# Patient Record
Sex: Female | Born: 1970 | Race: White | Hispanic: No | Smoking: Never smoker
Health system: Southern US, Community
[De-identification: ages and names within clinical notes are randomized; demographics above are authoritative.]

## PROBLEM LIST (undated history)

## (undated) DIAGNOSIS — Z9109 Other allergy status, other than to drugs and biological substances: Secondary | ICD-10-CM

## (undated) DIAGNOSIS — T7840XA Allergy, unspecified, initial encounter: Secondary | ICD-10-CM

## (undated) DIAGNOSIS — J45909 Unspecified asthma, uncomplicated: Secondary | ICD-10-CM

## (undated) HISTORY — DX: Unspecified asthma, uncomplicated: J45.909

## (undated) HISTORY — DX: Allergy, unspecified, initial encounter: T78.40XA

## (undated) HISTORY — PX: EYE SURGERY: SHX253

## (undated) HISTORY — DX: Other allergy status, other than to drugs and biological substances: Z91.09

---

## 1995-09-22 HISTORY — PX: KNEE SURGERY: SHX244

## 1998-11-07 ENCOUNTER — Inpatient Hospital Stay (HOSPITAL_COMMUNITY): Admission: AD | Admit: 1998-11-07 | Discharge: 1998-11-09 | Payer: Self-pay | Admitting: Obstetrics and Gynecology

## 1998-12-23 ENCOUNTER — Other Ambulatory Visit: Admission: RE | Admit: 1998-12-23 | Discharge: 1998-12-23 | Payer: Self-pay | Admitting: Obstetrics and Gynecology

## 1999-01-03 ENCOUNTER — Encounter (HOSPITAL_COMMUNITY): Admission: RE | Admit: 1999-01-03 | Discharge: 1999-04-03 | Payer: Self-pay | Admitting: *Deleted

## 2000-10-11 ENCOUNTER — Other Ambulatory Visit: Admission: RE | Admit: 2000-10-11 | Discharge: 2000-10-11 | Payer: Self-pay | Admitting: Gynecology

## 2000-10-27 ENCOUNTER — Encounter: Admission: RE | Admit: 2000-10-27 | Discharge: 2001-01-25 | Payer: Self-pay | Admitting: Gynecology

## 2001-05-06 ENCOUNTER — Inpatient Hospital Stay (HOSPITAL_COMMUNITY): Admission: AD | Admit: 2001-05-06 | Discharge: 2001-05-08 | Payer: Self-pay | Admitting: Gynecology

## 2001-06-15 ENCOUNTER — Other Ambulatory Visit: Admission: RE | Admit: 2001-06-15 | Discharge: 2001-06-15 | Payer: Self-pay | Admitting: Gynecology

## 2002-07-25 ENCOUNTER — Other Ambulatory Visit: Admission: RE | Admit: 2002-07-25 | Discharge: 2002-07-25 | Payer: Self-pay | Admitting: Gynecology

## 2004-04-24 ENCOUNTER — Other Ambulatory Visit: Admission: RE | Admit: 2004-04-24 | Discharge: 2004-04-24 | Payer: Self-pay | Admitting: Gynecology

## 2004-11-10 ENCOUNTER — Inpatient Hospital Stay (HOSPITAL_COMMUNITY): Admission: AD | Admit: 2004-11-10 | Discharge: 2004-11-12 | Payer: Self-pay | Admitting: Gynecology

## 2004-12-23 ENCOUNTER — Other Ambulatory Visit: Admission: RE | Admit: 2004-12-23 | Discharge: 2004-12-23 | Payer: Self-pay | Admitting: Gynecology

## 2006-01-06 ENCOUNTER — Other Ambulatory Visit: Admission: RE | Admit: 2006-01-06 | Discharge: 2006-01-06 | Payer: Self-pay | Admitting: Gynecology

## 2008-11-26 ENCOUNTER — Encounter: Payer: Self-pay | Admitting: Gynecology

## 2008-11-26 ENCOUNTER — Ambulatory Visit: Payer: Self-pay | Admitting: Gynecology

## 2008-11-26 ENCOUNTER — Other Ambulatory Visit: Admission: RE | Admit: 2008-11-26 | Discharge: 2008-11-26 | Payer: Self-pay | Admitting: Gynecology

## 2009-04-27 ENCOUNTER — Ambulatory Visit: Payer: Self-pay | Admitting: Diagnostic Radiology

## 2009-04-27 ENCOUNTER — Emergency Department (HOSPITAL_BASED_OUTPATIENT_CLINIC_OR_DEPARTMENT_OTHER): Admission: EM | Admit: 2009-04-27 | Discharge: 2009-04-27 | Payer: Self-pay | Admitting: Emergency Medicine

## 2011-02-06 NOTE — Discharge Summary (Signed)
[facility]  Patient:    MANHATTAN, MCCUEN Visit Number: [id number] MRN: [id number]          Service Type: OBS Location: 910A [id number] [id number] Attending Physician:  Douglass Rivers Dictated by:   Antony Contras, N.P. Admit Date:  05/06/2001 Discharge Date: 05/08/2001                             Discharge Summary  DISCHARGE DIAGNOSES:          Intrauterine pregnancy at term, spontaneous onset of labor.  PROCEDURE:                    Normal spontaneous vaginal delivery of viable infant over intact perineum with repair of second degree laceration.  HISTORY OF PRESENT ILLNESS:   Patient is a 40 year old gravida 2, para 1-0-0-1 with LMP July 25, 2000, [facility] May 01, 2001.  Prenatal course was uncomplicated.  PRENATAL LABORATORIES:        Blood type O+.  Antibody screen negative.  RPR, HBSAG, HIV nonreactive.  Rubella immune.  MSAFP normal.  GBS negative.  HOSPITAL COURSE:              Patient was admitted on May 06, 2001 in active labor.  Cervix on admission was 4 cm.  She was transferred to labor and delivery and while getting IV fluids in preparation for epidural complained of pressure and had dilated to a rim and -1 station.  She was delivered of an Apgar 8/9 female infant weighing 8 pounds 7 ounces over an intact perineum. Placenta was spontaneous.  Second degree laceration was repaired.  Postpartum course she remained afebrile.  Had no difficulty voiding.  Was able to be discharged in satisfactory condition on her second postpartum day.  LABORATORIES:                 CBC:  Hematocrit 31, hemoglobin 10.8, WBC 15.1, platelets 173,000.  DISPOSITION:                  Follow-up in six weeks.  Continue prenatal vitamins and iron, Motrin and Tylox for pain. Dictated by:   Antony Contras, N.P. Attending Physician:  Douglass Rivers DD:  05/20/01 TD:  05/20/01 Job: [id number]

## 2011-02-06 NOTE — H&P (Signed)
NAME:  Karen Forbes, Karen Forbes            ACCOUNT NO.:  [id number]   MEDICAL RECORD NO.:  [id number]          PATIENT TYPE:  INP   LOCATION:  9110                          FACILITY:  [facility]   PHYSICIAN:  Juan H. Lily Peer, M.D.DATE OF BIRTH:  02-11-1971   DATE OF ADMISSION:  11/10/2004  DATE OF DISCHARGE:                                HISTORY & PHYSICAL   CHIEF COMPLAINT:  Contractions.   HISTORY:  The patient is a 40 year old gravida 3, para 2 with a estimated  date of confinement of November 06, 2004, currently 40 4/[redacted] weeks gestation.  She presented to [facility] this morning complaining of contractions  that increased in intensity over the past 24 hours.  On arrival to maternity  admission, she was found to be 6 cm dilated, complete effacement and -1  station with intact membranes, reassuring fetal heart rate tracings,  contracting every two to three minutes apart.  The patient's prenatal record  is significant for the fact that she is of advanced maternal age and when  offered the opportunity to have amniocentesis, she declined.  She did have a  normal cystic fibrosis screen and decided that she did want to have a  maternal serum alpha fetoprotein test and when she was informed that her  maternal serum alpha fetoprotein was at increased risk for Down syndrome,  she was once again offered genetic amniocentesis, of which she and her  husband had declined.  Ultrasound screen at 18 and 20 weeks was reported to  be normal and otherwise, she had the remainder of her prenatal course had  been uneventful.   PAST MEDICAL HISTORY:  The patient denies any allergies.   She had two prior normal spontaneous vaginal deliveries in 2000 and 2002.  She denies any prior surgery, except on her eye.  She has no other medical  problems recorded per se.   REVIEW OF SYSTEMS:  See [place] form.   PHYSICAL EXAMINATION:  VITAL SIGNS:  Temperature 97.4, pulse 95, blood  pressure 99/65.  HEENT:   Unremarkable.  NECK:  Supple.  Trachea midline.  No carotid bruit or thyromegaly.  LUNGS:  Clear to auscultation without any rhonchi or wheezing.  HEART:  Regular rate and rhythm, no murmurs or gallops.  BREASTS:  Exam not done.  ABDOMEN:  Gravid uterus, vertex presentation by [employer].  PELVIC:  Cervix was 6 cm, completely effaced and -1 station, intact  membranes.  EXTREMITIES:  DTR 1+, negative clonus.  Trace edema.   PRENATAL LABORATORY:  O positive blood type,  negative antibody screen.  VDRL was nonreactive.  Hepatitis B surface antigen and HIV were negative.  Rubella with evidence of immunity.  The patient declined maternal serum  alpha fetoprotein.  Cystic fibrosis described to be normal.  Diabetes screen  was normal and GBS culture was negative.   ASSESSMENT:  40 year old gravida 3, para 3 at 67 4/[redacted] weeks gestation in  active labor with reassuring fetal heart rate tracing.  The patient  underwent artificial rupture of membranes with clear amniotic fluid.  The  patient refused internal monitoring.  Reassuring heart rate tracings  noted.  Contractions every two to three minutes part. She was  given 0.5 mg of Stadol, along with 12.5 mg of Phenergan IV.  She did not  wish to have an epidural.  GBS culture was negative and anticipate vaginal  delivery.   PLAN:  As per assessment above.      JHF/MEDQ  D:  11/10/2004  T:  11/10/2004  Job:  [id number]

## 2017-02-08 ENCOUNTER — Encounter: Payer: Self-pay | Admitting: Family Medicine

## 2017-02-08 ENCOUNTER — Ambulatory Visit (HOSPITAL_BASED_OUTPATIENT_CLINIC_OR_DEPARTMENT_OTHER)
Admission: RE | Admit: 2017-02-08 | Discharge: 2017-02-08 | Disposition: A | Discharge status: Home / Self Care | Payer: Managed Care, Other (non HMO) | Source: Ambulatory Visit | Attending: Family Medicine | Admitting: Family Medicine

## 2017-02-08 ENCOUNTER — Ambulatory Visit (INDEPENDENT_AMBULATORY_CARE_PROVIDER_SITE_OTHER): Payer: Managed Care, Other (non HMO) | Admitting: Family Medicine

## 2017-02-08 ENCOUNTER — Telehealth: Payer: Self-pay | Admitting: Behavioral Health

## 2017-02-08 VITALS — BP 114/72 | HR 95 | Temp 99.4°F | Ht 65.5 in | Wt 187.2 lb

## 2017-02-08 DIAGNOSIS — Z1231 Encounter for screening mammogram for malignant neoplasm of breast: Secondary | ICD-10-CM | POA: Insufficient documentation

## 2017-02-08 DIAGNOSIS — Z13220 Encounter for screening for lipoid disorders: Secondary | ICD-10-CM | POA: Diagnosis not present

## 2017-02-08 DIAGNOSIS — Z9109 Other allergy status, other than to drugs and biological substances: Secondary | ICD-10-CM | POA: Insufficient documentation

## 2017-02-08 DIAGNOSIS — M25512 Pain in left shoulder: Secondary | ICD-10-CM

## 2017-02-08 DIAGNOSIS — G8929 Other chronic pain: Secondary | ICD-10-CM | POA: Insufficient documentation

## 2017-02-08 DIAGNOSIS — Z131 Encounter for screening for diabetes mellitus: Secondary | ICD-10-CM

## 2017-02-08 DIAGNOSIS — Z638 Other specified problems related to primary support group: Secondary | ICD-10-CM

## 2017-02-08 DIAGNOSIS — Z1329 Encounter for screening for other suspected endocrine disorder: Secondary | ICD-10-CM

## 2017-02-08 DIAGNOSIS — Z124 Encounter for screening for malignant neoplasm of cervix: Secondary | ICD-10-CM

## 2017-02-08 DIAGNOSIS — Z1239 Encounter for other screening for malignant neoplasm of breast: Secondary | ICD-10-CM

## 2017-02-08 DIAGNOSIS — Z130 Encounter for screening for diseases of the blood and blood-forming organs and certain disorders involving the immune mechanism: Secondary | ICD-10-CM

## 2017-02-08 LAB — CBC
HCT: 40.4 % (ref 36.0–46.0)
HEMOGLOBIN: 13.7 g/dL (ref 12.0–15.0)
MCHC: 33.8 g/dL (ref 30.0–36.0)
MCV: 89.9 fl (ref 78.0–100.0)
Platelets: 278 10*3/uL (ref 150.0–400.0)
RBC: 4.49 Mil/uL (ref 3.87–5.11)
RDW: 14.2 % (ref 11.5–15.5)
WBC: 9.3 10*3/uL (ref 4.0–10.5)

## 2017-02-08 LAB — LIPID PANEL
CHOL/HDL RATIO: 4
Cholesterol: 213 mg/dL — ABNORMAL HIGH (ref 0–200)
HDL: 52.3 mg/dL (ref >39.00)
LDL Cholesterol: 122 mg/dL — ABNORMAL HIGH (ref 0–99)
NonHDL: 160.75
Triglycerides: 195 mg/dL — ABNORMAL HIGH (ref 0.0–149.0)
VLDL: 39 mg/dL (ref 0.0–40.0)

## 2017-02-08 LAB — COMPREHENSIVE METABOLIC PANEL
ALT: 13 U/L (ref 0–35)
AST: 15 U/L (ref 0–37)
Albumin: 4 g/dL (ref 3.5–5.2)
Alkaline Phosphatase: 53 U/L (ref 39–117)
BILIRUBIN TOTAL: 0.4 mg/dL (ref 0.2–1.2)
BUN: 14 mg/dL (ref 6–23)
CHLORIDE: 106 meq/L (ref 96–112)
CO2: 25 meq/L (ref 19–32)
Calcium: 8.8 mg/dL (ref 8.4–10.5)
Creatinine, Ser: 0.97 mg/dL (ref 0.40–1.20)
GFR: 65.15 mL/min (ref >60.00)
GLUCOSE: 114 mg/dL — AB (ref 70–99)
Potassium: 3.9 mEq/L (ref 3.5–5.1)
Sodium: 137 mEq/L (ref 135–145)
Total Protein: 6.7 g/dL (ref 6.0–8.3)

## 2017-02-08 LAB — HEMOGLOBIN A1C: Hgb A1c MFr Bld: 5.7 % (ref 4.6–6.5)

## 2017-02-08 LAB — TSH: TSH: 0.52 u[IU]/mL (ref 0.35–4.50)

## 2017-02-08 MED ORDER — ALBUTEROL SULFATE HFA 108 (90 BASE) MCG/ACT IN AERS: 2.0000 | INHALATION_SPRAY | Freq: Four times a day (QID) | RESPIRATORY_TRACT | 0 refills | Status: AC | PRN

## 2017-02-08 NOTE — Patient Instructions (Addendum)
It was very nice to see you today!  I will be in touch with your labs asap Please have your blood drawn, and then stop by imaging services on the ground floor to schedule your mammo (and maybe even have it done today!)  I will set you up to see Dr. Ihor Dow for your pap   I am sorry for all the trouble your family has been through but glad that you are doing well.  Please let me know if I can be of any assistance

## 2017-02-08 NOTE — Telephone Encounter (Signed)
Unable to reach patient at time of Pre-Visit Call.  Left message for patient to return call when available.    
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
## 2017-03-08 ENCOUNTER — Telehealth: Payer: Self-pay | Admitting: *Deleted

## 2017-03-08 NOTE — Telephone Encounter (Signed)
Received Medical records from [facility], forwarded to provider/SLS 06/18

## 2017-03-09 ENCOUNTER — Telehealth: Payer: Self-pay | Admitting: Family Medicine

## 2017-03-09 NOTE — Telephone Encounter (Signed)
Received records from [facility]- will abstract and scan as needed

## 2017-03-12 ENCOUNTER — Encounter: Payer: Self-pay | Admitting: Family Medicine

## 2017-03-12 ENCOUNTER — Ambulatory Visit (INDEPENDENT_AMBULATORY_CARE_PROVIDER_SITE_OTHER): Payer: Managed Care, Other (non HMO) | Admitting: Family Medicine

## 2017-03-12 VITALS — BP 126/60 | HR 91 | Temp 98.4°F | Ht 65.5 in | Wt 192.0 lb

## 2017-03-12 DIAGNOSIS — J00 Acute nasopharyngitis [common cold]: Secondary | ICD-10-CM | POA: Diagnosis not present

## 2017-03-12 DIAGNOSIS — J4520 Mild intermittent asthma, uncomplicated: Secondary | ICD-10-CM

## 2017-03-12 DIAGNOSIS — J45909 Unspecified asthma, uncomplicated: Secondary | ICD-10-CM | POA: Insufficient documentation

## 2017-03-12 NOTE — Progress Notes (Signed)
Chief Complaint  Patient presents with  . Dry eyes    wheezing,throat burning,cough(dry),h/a-sxs started on Tues    Karen Forbes here for URI complaints.  The patient has been having wheezing, burning throat, cough, headache, and burning/dry eyes over the past 3 days. 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 Ever since then, both she and her coworkers have been having similar symptoms. No one has been having fevers or rigors. She has been using artificial tears and Zyrtec with little relief.   ROS:  Const: Denies fevers HEENT: As noted in HPI Lungs: +cough  Past Medical History:  Diagnosis Date  . Allergy-induced asthma   . Environmental allergies    Family History  Problem Relation Age of Onset  . Arthritis Mother   . Hyperlipidemia Father   . Hypertension Father   . Hypertension Maternal Grandfather   . Diabetes Maternal Grandfather   . Arthritis Paternal Grandmother   . Breast cancer Paternal Grandmother     BP 126/60 (BP Location: Left Arm, Patient Position: Sitting, Cuff Size: Normal)   Pulse 91   Temp 98.4 F (36.9 C) (Oral)   Ht 5' 5.5" (1.664 m)   Wt 192 lb (87.1 kg)   LMP 03/08/2017 (Exact Date)   SpO2 98%   BMI 31.46 kg/m  General: Awake, alert, appears stated age HEENT: AT, [place], ears patent b/l and TM's neg, nares patent w/o discharge, pharynx pink and without exudates, MMM Neck: No masses or asymmetry Heart: RRR, no murmurs, no bruits Lungs: CTAB, no accessory muscle use Psych: Age appropriate judgment and insight, normal mood and affect  Acute irritant rhinitis  Mild intermittent extrinsic asthma without complication  Start using albuterol prn. Letter for work stating her health will be optimized in a setting without high concentrations of chlorine. Also excused her over next few days.  Continue to push fluids, practice good hand hygiene, cover mouth when  coughing. F/u prn. If starting to experience fevers, shaking, or shortness of breath, seek immediate care. If albuterol is not helpful, she will call us back and we will call in an ICS. Pt voiced understanding and agreement to the plan.  [place], DO 03/12/17 10:28 AM

## 2017-03-12 NOTE — Patient Instructions (Signed)
Artificial tears is ideal.   Keep up with the Zyrtec and albuterol as needed.   If the albuterol is not giving you enough relief, call us and we will call in new inhaler to take scheduled.

## 2017-03-16 ENCOUNTER — Telehealth: Payer: Self-pay | Admitting: Family Medicine

## 2017-03-16 NOTE — Telephone Encounter (Signed)
Caller name: Luanna Salk from [place]  Call back number: [phone]    Reason for call:  Workers [employer] from [employer] were patient works requesting office notes for 03/12/17 please fax to # [phone] and note claim # [id number]

## 2017-03-17 ENCOUNTER — Encounter: Payer: Self-pay | Admitting: Obstetrics & Gynecology

## 2017-03-17 ENCOUNTER — Ambulatory Visit (INDEPENDENT_AMBULATORY_CARE_PROVIDER_SITE_OTHER): Payer: Managed Care, Other (non HMO) | Admitting: Obstetrics & Gynecology

## 2017-03-17 VITALS — BP 124/60 | HR 68 | Ht 65.0 in | Wt 198.0 lb

## 2017-03-17 DIAGNOSIS — Z01419 Encounter for gynecological examination (general) (routine) without abnormal findings: Secondary | ICD-10-CM | POA: Diagnosis not present

## 2017-03-17 DIAGNOSIS — N393 Stress incontinence (female) (male): Secondary | ICD-10-CM

## 2017-03-17 NOTE — Telephone Encounter (Signed)
Office notes faxed as requested.

## 2017-03-17 NOTE — Progress Notes (Signed)
Subjective:     Karen Forbes is a 46 y.o. female here for a routine exam. G3P3  Pt breast fed for 8-9 months per child  Current complaints: no GYN concerns to day.  Caffeine 1-2 cups of coffee. Sometimes 4 cups per day. Did Kegel exercises in the past but, hey 'did not help'. Pt does ot recall how they were done or how long she performed them. She reports that her sx have occurred since the birht of her first child. She is married but, separated. She works as a Cabin crew part time.    PGM breast cancer in her 84's.    Gynecologic History Patient's last menstrual period was 03/08/2017 (exact date). Contraception: none x 1 1 1/2 years Last Pap: unsure  2010 ASCUS- pt does not know what happened to this. Last mammogram: 02/08/2017. Results were: normal  Obstetric History OB History  Gravida Para Term Preterm AB Living  3 3          SAB TAB Ectopic Multiple Live Births          3    # Outcome Date GA Lbr Len/2nd Weight Sex Delivery Anes PTL Lv  3 Para      Vag-Spont     2 Para      Vag-Spont     1 Para      Vag-Spont         The following portions of the patient's history were reviewed and updated as appropriate: allergies, current medications, past family history, past medical history, past social history, past surgical history and problem list.  Review of Systems Pertinent items are noted in HPI.    Objective:  BP 124/60 (BP Location: Left Arm)   Pulse 68   Ht 5\' 5"  (1.651 m)   Wt 198 lb (89.8 kg)   LMP 03/08/2017 (Exact Date)   BMI 32.95 kg/m   General Appearance:    Alert, cooperative, no distress, appears stated age  Head:    Normocephalic, without obvious abnormality, atraumatic  Eyes:    conjunctiva/corneas clear, EOM's intact, both eyes  Ears:    Normal external ear canals, both ears  Nose:   Nares normal, septum midline, mucosa normal, no drainage    or sinus tenderness  Throat:   Lips, mucosa, and tongue normal; teeth and gums normal  Neck:   Supple,  symmetrical, trachea midline, no adenopathy;    thyroid:  no enlargement/tenderness/nodules  Back:     Symmetric, no curvature, ROM normal, no CVA tenderness  Lungs:     Clear to auscultation bilaterally, respirations unlabored  Chest Wall:    No tenderness or deformity   Heart:    Regular rate and rhythm, S1 and S2 normal, no murmur, rub   or gallop  Breast Exam:    No tenderness, masses, or nipple abnormality  Abdomen:     Soft, non-tender, bowel sounds active all four quadrants,    no masses, no organomegaly  Genitalia:    Normal female without lesion, discharge or tenderness q-tip test 60 degrees of laxity. There is some mild uterine prolapse. The cervix is very anterior.     Extremities:   Extremities normal, atraumatic, no cyanosis or edema  Pulses:   2+ and symmetric all extremities  Skin:   Skin color, texture, turgor normal, no rashes or lesions    Assessment:    Healthy female exam.   Stress urinary incontinence- reviewed treatment options with pt including conservative and surgical management.  Pt is not interested in surgery at this time.     Plan:    Follow up in: 3 months.   to reeval SUI Reviewed Kegel exercises offered PT for assist with Kegel's etc Rec eliminate of discontinue caffeine F/u PAP and hrHPV F/u in 1 year for annual  Reviewed mammogram screening guidelines   Eloyse Causey L. Ihor Dow, M.D., [place] .

## 2017-03-17 NOTE — Patient Instructions (Signed)

## 2017-03-17 NOTE — Addendum Note (Signed)
Addended by: Phill Myron on: 03/17/2017 03:02 PM   Modules accepted: Orders

## 2017-03-23 LAB — CYTOLOGY - PAP
Diagnosis: NEGATIVE
HPV (WINDOPATH): NOT DETECTED

## 2017-10-01 ENCOUNTER — Ambulatory Visit: Payer: 59 | Admitting: Psychology

## 2017-10-11 ENCOUNTER — Ambulatory Visit: Payer: 59 | Admitting: Psychology

## 2018-01-04 NOTE — Progress Notes (Signed)
Karen Forbes at [facility] [address] [place], [place] [address] [phone]  Date:  01/06/2018   Name:  Karen Forbes   DOB:  1971-04-11   MRN:  [id number]  PCP:  Darreld Mclean, MD    Chief Complaint: Foot Pain (c/o left foot pain since Oct or Nov. )   History of Present Illness:  Karen Forbes is a 47 y.o. very pleasant female patient who presents with the following:  Generally healthy woman here today with concern of a left foot issue She has noted pain with walking, etc  This started in the fall- October or November.  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;did not always use them but she is trying to be better about wearing them  She works around water a lot and had been wearing flip flops a lot which are not that supportive  She started using a compression sock for her foot as well, thinking that it might be plantar fascitis However her foot is even hurting at rest now  NKI that she can recall Pain is worst after she gets up in the am or if sitting for a while and then she gets up   She did walk about 3.5 miles the other day- this was fine, but the next day she had a lot of pain  She is otherwise well- no fever or chills No other injury except her ankle will sometimes feel sore, she thinks from walking on the lateral side of her foot to protect the bottom of her foot   BP Readings from Last 3 Encounters:  01/06/18 98/61  03/17/17 124/60  03/12/17 126/60      Patient Active Problem List   Diagnosis Date Noted  . 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 Chronic left shoulder pain 02/08/2017  . Environmental allergies 02/08/2017    Past Medical History:  Diagnosis Date  . Allergy-induced asthma   . Environmental allergies     Past Surgical History:  Procedure Laterality Date  . EYE SURGERY      Corrective Eye Surgery in 1974, 1976 and 1984  . KNEE SURGERY  1997    Social History   Tobacco Use  . Smoking status: Never Smoker  . Smokeless tobacco: Never Used  Substance Use Topics  . Alcohol use: Yes  . Drug use: No    Family History  Problem Relation Age of Onset  . Arthritis Mother   . Hyperlipidemia Father   . Hypertension Father   . Hypertension Maternal Grandfather   . Diabetes Maternal Grandfather   . Arthritis Paternal Grandmother   . Breast cancer Paternal Grandmother     No Known Allergies  Medication list has been reviewed and updated.  Current Outpatient Medications on File Prior to Visit  Medication Sig Dispense Refill  . albuterol (PROVENTIL HFA;VENTOLIN HFA) 108 (90 Base) MCG/ACT inhaler Inhale 2 puffs into the lungs every 6 (six) hours as needed for wheezing or shortness of breath. 1 Inhaler 0  . cetirizine (ZYRTEC) 10 MG tablet Take 10 mg by mouth daily.     No current facility-administered medications on file prior to visit.     Review of Systems:  As per HPI- otherwise negative.   Physical Examination: Vitals:   01/06/18 1549  BP: 98/61  Pulse: 87  Temp: 98.8 F (37.1 C)  SpO2: 98%   Vitals:   01/06/18 1549  Weight: 194 lb 9.6 oz (88.3 kg)  Height: 5\' 5"  (1.651 m)   Body mass index is 32.38 kg/m. Ideal Body Weight: Weight in (lb) to have BMI = 25: 149.9  GEN: WDWN, NAD, Non-toxic, A & O x 3, looks well, overweight HEENT: Atraumatic, Normocephalic. Neck supple. No masses, No LAD. Ears and Nose: No external deformity. CV: RRR, No M/G/R. No JVD. No thrill. No extra heart sounds. PULM: CTA B, no wheezes, crackles, rhonchi. No retractions. No resp. distress. No accessory muscle use. EXTR: No c/c/e NEURO Normal gait.  PSYCH: Normally interactive. Conversant. Not depressed or anxious appearing.  Calm demeanor.  Left foot:  She is quite tender over the proximal plantar insertion of the plantar fascis Normal arches Foot is  otherwise normal - no other tenderness, redness, swelling Ankle is negative. Achilles normal   Assessment and Plan: Plantar fasciitis  Left foot pain  Here today with left sided plantar fasciitis  Discussed management of this condition with her in detail.  A friend already gave her some sort of foot brace which really seems to have helped.  She will continue to use this, and we discussed ice massage and stretching. She will let me know if not improving   Signed Lamar Blinks, MD

## 2018-01-06 ENCOUNTER — Ambulatory Visit: Payer: BLUE CROSS/BLUE SHIELD | Admitting: Family Medicine

## 2018-01-06 ENCOUNTER — Encounter: Payer: Self-pay | Admitting: Family Medicine

## 2018-01-06 VITALS — BP 110/75 | HR 87 | Temp 98.8°F | Ht 65.0 in | Wt 194.6 lb

## 2018-01-06 DIAGNOSIS — M722 Plantar fascial fibromatosis: Secondary | ICD-10-CM

## 2018-01-06 DIAGNOSIS — M79672 Pain in left foot: Secondary | ICD-10-CM

## 2018-01-06 NOTE — Patient Instructions (Addendum)
It does seem that you have plantar fascitis. Use your foot brace at night as needed Try some ice massage, stretching before you get out of bed or before you get up after sitting  You might try some aleve or ibuprofen for a week or two  Try to avoid being barefoot or wearing flip flops   Let me know if this does not settle down- in that case I can refer you to sports med!  This may take a while to resolve completely, but please alert me if not at least some better soon
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
## 2018-03-07 ENCOUNTER — Encounter: Payer: Self-pay | Admitting: Family Medicine

## 2018-03-07 ENCOUNTER — Ambulatory Visit (INDEPENDENT_AMBULATORY_CARE_PROVIDER_SITE_OTHER): Payer: BLUE CROSS/BLUE SHIELD | Admitting: Family Medicine

## 2018-03-07 ENCOUNTER — Ambulatory Visit (HOSPITAL_BASED_OUTPATIENT_CLINIC_OR_DEPARTMENT_OTHER)
Admission: RE | Admit: 2018-03-07 | Discharge: 2018-03-07 | Disposition: A | Discharge status: Home / Self Care | Payer: BLUE CROSS/BLUE SHIELD | Source: Ambulatory Visit | Attending: Family Medicine | Admitting: Family Medicine

## 2018-03-07 VITALS — BP 124/78 | HR 68 | Temp 98.3°F | Resp 16 | Ht 66.0 in | Wt 195.0 lb

## 2018-03-07 DIAGNOSIS — Z130 Encounter for screening for diseases of the blood and blood-forming organs and certain disorders involving the immune mechanism: Secondary | ICD-10-CM | POA: Diagnosis not present

## 2018-03-07 DIAGNOSIS — M722 Plantar fascial fibromatosis: Secondary | ICD-10-CM | POA: Diagnosis not present

## 2018-03-07 DIAGNOSIS — Z13220 Encounter for screening for lipoid disorders: Secondary | ICD-10-CM | POA: Diagnosis not present

## 2018-03-07 DIAGNOSIS — Z1231 Encounter for screening mammogram for malignant neoplasm of breast: Secondary | ICD-10-CM | POA: Insufficient documentation

## 2018-03-07 DIAGNOSIS — Z1239 Encounter for other screening for malignant neoplasm of breast: Secondary | ICD-10-CM

## 2018-03-07 DIAGNOSIS — K625 Hemorrhage of anus and rectum: Secondary | ICD-10-CM

## 2018-03-07 DIAGNOSIS — Z0000 Encounter for general adult medical examination without abnormal findings: Secondary | ICD-10-CM

## 2018-03-07 DIAGNOSIS — R221 Localized swelling, mass and lump, neck: Secondary | ICD-10-CM

## 2018-03-07 DIAGNOSIS — Z131 Encounter for screening for diabetes mellitus: Secondary | ICD-10-CM

## 2018-03-07 LAB — CBC
HEMATOCRIT: 40 % (ref 36.0–46.0)
HEMOGLOBIN: 13.4 g/dL (ref 12.0–15.0)
MCHC: 33.6 g/dL (ref 30.0–36.0)
MCV: 92.2 fl (ref 78.0–100.0)
Platelets: 251 10*3/uL (ref 150.0–400.0)
RBC: 4.34 Mil/uL (ref 3.87–5.11)
RDW: 14 % (ref 11.5–15.5)
WBC: 6 10*3/uL (ref 4.0–10.5)

## 2018-03-07 LAB — COMPREHENSIVE METABOLIC PANEL
ALBUMIN: 4 g/dL (ref 3.5–5.2)
ALK PHOS: 53 U/L (ref 39–117)
ALT: 12 U/L (ref 0–35)
AST: 13 U/L (ref 0–37)
BUN: 14 mg/dL (ref 6–23)
CALCIUM: 9 mg/dL (ref 8.4–10.5)
CO2: 29 mEq/L (ref 19–32)
Chloride: 105 mEq/L (ref 96–112)
Creatinine, Ser: 0.87 mg/dL (ref 0.40–1.20)
GFR: 73.53 mL/min (ref >60.00)
Glucose, Bld: 93 mg/dL (ref 70–99)
POTASSIUM: 4 meq/L (ref 3.5–5.1)
Sodium: 141 mEq/L (ref 135–145)
TOTAL PROTEIN: 6.6 g/dL (ref 6.0–8.3)
Total Bilirubin: 0.4 mg/dL (ref 0.2–1.2)

## 2018-03-07 LAB — LIPID PANEL
Cholesterol: 200 mg/dL (ref 0–200)
HDL: 49.3 mg/dL (ref >39.00)
LDL Cholesterol: 118 mg/dL — ABNORMAL HIGH (ref 0–99)
NONHDL: 150.65
TRIGLYCERIDES: 162 mg/dL — AB (ref 0.0–149.0)
Total CHOL/HDL Ratio: 4
VLDL: 32.4 mg/dL (ref 0.0–40.0)

## 2018-03-07 LAB — HEMOGLOBIN A1C: HEMOGLOBIN A1C: 5.6 % (ref 4.6–6.5)

## 2018-03-07 LAB — TSH: TSH: 0.81 u[IU]/mL (ref 0.35–4.50)

## 2018-03-07 NOTE — Patient Instructions (Signed)
Great to see you today- I will be in touch with your labs asap Please drop by the imaging dept on the ground floor to set up or hopefully have a mammogram today I will refer you to GI to dissuss doing a colonoscopy for you Let me know if your foot pain gets to be where you need me to refer you   Health Maintenance, Female Adopting a healthy lifestyle and getting preventive care can go a long way to promote health and wellness. Talk with your health care provider about what schedule of regular examinations is right for you. This is a good chance for you to check in with your provider about disease prevention and staying healthy. In between checkups, there are plenty of things you can do on your own. Experts have done a lot of research about which lifestyle changes and preventive measures are most likely to keep you healthy. Ask your health care provider for more information. Weight and diet Eat a healthy diet  Be sure to include plenty of vegetables, fruits, low-fat dairy products, and lean protein.  Do not eat a lot of foods high in solid fats, added sugars, or salt.  Get regular exercise. This is one of the most important things you can do for your health. ? Most adults should exercise for at least 150 minutes each week. The exercise should increase your heart rate and make you sweat (moderate-intensity exercise). ? Most adults should also do strengthening exercises at least twice a week. This is in addition to the moderate-intensity exercise.  Maintain a healthy weight  Body mass index (BMI) is a measurement that can be used to identify possible weight problems. It estimates body fat based on height and weight. Your health care provider can help determine your BMI and help you achieve or maintain a healthy weight.  For females 33 years of age and older: ? A BMI below 18.5 is considered underweight. ? A BMI of 18.5 to 24.9 is normal. ? A BMI of 25 to 29.9 is considered overweight. ? A BMI  of 30 and above is considered obese.  Watch levels of cholesterol and blood lipids  You should start having your blood tested for lipids and cholesterol at 49 years of age, then have this test every 5 years.  You may need to have your cholesterol levels checked more often if: ? Your lipid or cholesterol levels are high. ? You are older than 49 years of age. ? You are at high risk for heart disease.  Cancer screening Lung Cancer  Lung cancer screening is recommended for adults 2-73 years old who are at high risk for lung cancer because of a history of smoking.  A yearly low-dose CT scan of the lungs is recommended for people who: ? Currently smoke. ? Have quit within the past 15 years. ? Have at least a 30-pack-year history of smoking. A pack year is smoking an average of one pack of cigarettes a day for 1 year.  Yearly screening should continue until it has been 15 years since you quit.  Yearly screening should stop if you develop a health problem that would prevent you from having lung cancer treatment.  Breast Cancer  Practice breast self-awareness. This means understanding how your breasts normally appear and feel.  It also means doing regular breast self-exams. Let your health care provider know about any changes, no matter how small.  If you are in your 20s or 30s, you should have a clinical  breast exam (CBE) by a health care provider every 1-3 years as part of a regular health exam.  If you are 65 or older, have a CBE every year. Also consider having a breast X-ray (mammogram) every year.  If you have a family history of breast cancer, talk to your health care provider about genetic screening.  If you are at high risk for breast cancer, talk to your health care provider about having an MRI and a mammogram every year.  Breast cancer gene (BRCA) assessment is recommended for women who have family members with BRCA-related cancers. BRCA-related cancers  include: ? Breast. ? Ovarian. ? Tubal. ? Peritoneal cancers.  Results of the assessment will determine the need for genetic counseling and BRCA1 and BRCA2 testing.  Cervical Cancer Your health care provider may recommend that you be screened regularly for cancer of the pelvic organs (ovaries, uterus, and vagina). This screening involves a pelvic examination, including checking for microscopic changes to the surface of your cervix (Pap test). You may be encouraged to have this screening done every 3 years, beginning at age 84.  For women ages 5-65, health care providers may recommend pelvic exams and Pap testing every 3 years, or they may recommend the Pap and pelvic exam, combined with testing for human papilloma virus (HPV), every 5 years. Some types of HPV increase your risk of cervical cancer. Testing for HPV may also be done on women of any age with unclear Pap test results.  Other health care providers may not recommend any screening for nonpregnant women who are considered low risk for pelvic cancer and who do not have symptoms. Ask your health care provider if a screening pelvic exam is right for you.  If you have had past treatment for cervical cancer or a condition that could lead to cancer, you need Pap tests and screening for cancer for at least 20 years after your treatment. If Pap tests have been discontinued, your risk factors (such as having a new sexual partner) need to be reassessed to determine if screening should resume. Some women have medical problems that increase the chance of getting cervical cancer. In these cases, your health care provider may recommend more frequent screening and Pap tests.  Colorectal Cancer  This type of cancer can be detected and often prevented.  Routine colorectal cancer screening usually begins at 49 years of age and continues through 49 years of age.  Your health care provider may recommend screening at an earlier age if you have risk factors  for colon cancer.  Your health care provider may also recommend using home test kits to check for hidden blood in the stool.  A small camera at the end of a tube can be used to examine your colon directly (sigmoidoscopy or colonoscopy). This is done to check for the earliest forms of colorectal cancer.  Routine screening usually begins at age 19.  Direct examination of the colon should be repeated every 5-10 years through 49 years of age. However, you may need to be screened more often if early forms of precancerous polyps or small growths are found.  Skin Cancer  Check your skin from head to toe regularly.  Tell your health care provider about any new moles or changes in moles, especially if there is a change in a mole's shape or color.  Also tell your health care provider if you have a mole that is larger than the size of a pencil eraser.  Always use sunscreen. Apply  sunscreen liberally and repeatedly throughout the day.  Protect yourself by wearing long sleeves, pants, a wide-brimmed hat, and sunglasses whenever you are outside.  Heart disease, diabetes, and high blood pressure  High blood pressure causes heart disease and increases the risk of stroke. High blood pressure is more likely to develop in: ? People who have blood pressure in the high end of the normal range (130-139/85-89 mm Hg). ? People who are overweight or obese. ? People who are African American.  If you are 23-42 years of age, have your blood pressure checked every 3-5 years. If you are 21 years of age or older, have your blood pressure checked every year. You should have your blood pressure measured twice-once when you are at a hospital or clinic, and once when you are not at a hospital or clinic. Record the average of the two measurements. To check your blood pressure when you are not at a hospital or clinic, you can use: ? An automated blood pressure machine at a pharmacy. ? A home blood pressure monitor.  If  you are between 14 years and 30 years old, ask your health care provider if you should take aspirin to prevent strokes.  Have regular diabetes screenings. This involves taking a blood sample to check your fasting blood sugar level. ? If you are at a normal weight and have a low risk for diabetes, have this test once every three years after 49 years of age. ? If you are overweight and have a high risk for diabetes, consider being tested at a younger age or more often. Preventing infection Hepatitis B  If you have a higher risk for hepatitis B, you should be screened for this virus. You are considered at high risk for hepatitis B if: ? You were born in a country where hepatitis B is common. Ask your health care provider which countries are considered high risk. ? Your parents were born in a high-risk country, and you have not been immunized against hepatitis B (hepatitis B vaccine). ? You have HIV or AIDS. ? You use needles to inject street drugs. ? You live with someone who has hepatitis B. ? You have had sex with someone who has hepatitis B. ? You get hemodialysis treatment. ? You take certain medicines for conditions, including cancer, organ transplantation, and autoimmune conditions.  Hepatitis C  Blood testing is recommended for: ? Everyone born from [birth year] through 1967. ? Anyone with known risk factors for hepatitis C.  Sexually transmitted infections (STIs)  You should be screened for sexually transmitted infections (STIs) including gonorrhea and chlamydia if: ? You are sexually active and are younger than 49 years of age. ? You are older than 49 years of age and your health care provider tells you that you are at risk for this type of infection. ? Your sexual activity has changed since you were last screened and you are at an increased risk for chlamydia or gonorrhea. Ask your health care provider if you are at risk.  If you do not have HIV, but are at risk, it may be recommended  that you take a prescription medicine daily to prevent HIV infection. This is called pre-exposure prophylaxis (PrEP). You are considered at risk if: ? You are sexually active and do not regularly use condoms or know the HIV status of your partner(s). ? You take drugs by injection. ? You are sexually active with a partner who has HIV.  Talk with your health care provider  about whether you are at high risk of being infected with HIV. If you choose to begin PrEP, you should first be tested for HIV. You should then be tested every 3 months for as long as you are taking PrEP. Pregnancy  If you are premenopausal and you may become pregnant, ask your health care provider about preconception counseling.  If you may become pregnant, take 400 to 800 micrograms (mcg) of folic acid every day.  If you want to prevent pregnancy, talk to your health care provider about birth control (contraception). Osteoporosis and menopause  Osteoporosis is a disease in which the bones lose minerals and strength with aging. This can result in serious bone fractures. Your risk for osteoporosis can be identified using a bone density scan.  If you are 35 years of age or older, or if you are at risk for osteoporosis and fractures, ask your health care provider if you should be screened.  Ask your health care provider whether you should take a calcium or vitamin D supplement to lower your risk for osteoporosis.  Menopause may have certain physical symptoms and risks.  Hormone replacement therapy may reduce some of these symptoms and risks. Talk to your health care provider about whether hormone replacement therapy is right for you. Follow these instructions at home:  Schedule regular health, dental, and eye exams.  Stay current with your immunizations.  Do not use any tobacco products including cigarettes, chewing tobacco, or electronic cigarettes.  If you are pregnant, do not drink alcohol.  If you are  breastfeeding, limit how much and how often you drink alcohol.  Limit alcohol intake to no more than 1 drink per day for nonpregnant women. One drink equals 12 ounces of beer, 5 ounces of wine, or 1 ounces of hard liquor.  Do not use street drugs.  Do not share needles.  Ask your health care provider for help if you need support or information about quitting drugs.  Tell your health care provider if you often feel depressed.  Tell your health care provider if you have ever been abused or do not feel safe at home. This information is not intended to replace advice given to you by your health care provider. Make sure you discuss any questions you have with your health care provider. Document Released: 03/23/2011 Document Revised: 02/13/2016 Document Reviewed: 06/11/2015 Elsevier Interactive Patient Education  [employer].

## 2018-05-16 ENCOUNTER — Encounter: Payer: Self-pay | Admitting: Family Medicine

## 2018-07-13 ENCOUNTER — Ambulatory Visit: Payer: Self-pay

## 2018-07-26 ENCOUNTER — Encounter: Payer: Self-pay | Admitting: Gastroenterology

## 2018-08-10 ENCOUNTER — Encounter: Payer: Self-pay | Admitting: Gastroenterology

## 2018-08-10 ENCOUNTER — Ambulatory Visit: Payer: BLUE CROSS/BLUE SHIELD | Admitting: Gastroenterology

## 2018-08-10 VITALS — BP 124/78 | HR 90 | Ht 65.0 in | Wt 197.4 lb

## 2018-08-10 DIAGNOSIS — K921 Melena: Secondary | ICD-10-CM

## 2018-08-10 MED ORDER — SOD PICOSULFATE-MAG OX-CIT ACD 10-3.5-12 MG-GM -GM/160ML PO SOLN: 1.0000 | ORAL | 0 refills | Status: DC

## 2018-08-10 NOTE — Patient Instructions (Addendum)
If you are age 49 or older, your body mass index should be between 23-30. Your Body mass index is 32.84 kg/m. If this is out of the aforementioned range listed, please consider follow up with your Primary Care Provider.  If you are age 66 or younger, your body mass index should be between 19-25. Your Body mass index is 32.84 kg/m. If this is out of the aformentioned range listed, please consider follow up with your Primary Care Provider.   You have been scheduled for a colonoscopy. Please follow written instructions given to you at your visit today.  Please pick up your prep supplies at the pharmacy within the next 1-3 days. If you use inhalers (even only as needed), please bring them with you on the day of your procedure. Your physician has requested that you go to www.startemmi.com and enter the access code given to you at your visit today. This web site gives a general overview about your procedure. However, you should still follow specific instructions given to you by our office regarding your preparation for the procedure.  TWO DAYS BEFORE PROCEDURE: Take 2 ducolax tablets in the morning and again in the evening.  We have sent the following medications to your pharmacy for you to pick up at your convenience: Clenpiq  Please purchase the following medications over the counter and take as directed: Ducolax  .It was a pleasure to see you today!  Vito Cirigliano, D.O.

## 2018-08-10 NOTE — Progress Notes (Signed)
Chief Complaint: Hematochezia  Referring Provider:     Charise Carwin, MD    HPI:     Karen Forbes is a 47 y.o. female referred to the [facility] for evaluation of hematochezia.  She states this started approx 1 year ago, occurring intermittently.   Recent labs notable for normal CMP and CBC.  No recent abdominal imaging for review.  She was last seen by PCM in 02/2018.  At that time described hemorrhoids with rectal bleeding over the preceding 4 months or so along with constipation/hard stools.  Used OTC hemorrhoid medications and dietary modifications to promote softer stools with overall improvement.  Was referred to GI at that time, but she did not schedule until today.  Today she states that her symptoms have continued essentially unchanged since June.  Otherwise, no associated shortness of breath, dyspnea on exertion, chest pain, lightheadedness, fatigue.  No known family history of CRC, GI malignancy, liver disease, pancreatic disease, or IBD.   No previous EGD or colonoscopy.   Past Medical History:  Diagnosis Date  . Allergy-induced asthma   . Environmental allergies      Past Surgical History:  Procedure Laterality Date  . EYE SURGERY     Corrective Eye Surgery in 1974, 1976 and 1984  . KNEE SURGERY  1997   Family History  Problem Relation Age of Onset  . Arthritis Mother   . Colon polyps Mother   . Hyperlipidemia Father   . Hypertension Father   . Colon polyps Father   . Hypertension Maternal Grandfather   . Diabetes Maternal Grandfather   . Arthritis Paternal Grandmother   . Breast cancer Paternal Grandmother   . Colon cancer Neg Hx    Social History   Tobacco Use  . Smoking status: Never Smoker  . Smokeless tobacco: Never Used  Substance Use Topics  . Alcohol use: Yes    Comment: ocassionally  . Drug use: No   Current Outpatient Medications  Medication Sig Dispense Refill  . albuterol (PROVENTIL HFA;VENTOLIN  HFA) 108 (90 Base) MCG/ACT inhaler Inhale 2 puffs into the lungs every 6 (six) hours as needed for wheezing or shortness of breath. 1 Inhaler 0  . cetirizine (ZYRTEC) 10 MG tablet Take 10 mg by mouth daily.     No current facility-administered medications for this visit.    No Known Allergies   Review of Systems: All systems reviewed and negative except where noted in HPI.     Physical Exam:    Wt Readings from Last 3 Encounters:  08/10/18 197 lb 6 oz (89.5 kg)  03/07/18 195 lb (88.5 kg)  01/06/18 194 lb 9.6 oz (88.3 kg)    BP 124/78   Pulse 90   Ht 5\' 5"  (1.651 m)   Wt 197 lb 6 oz (89.5 kg)   BMI 32.84 kg/m  Constitutional:  Pleasant, in no acute distress. Psychiatric: Normal mood and affect. Behavior is normal. EENT: Pupils normal.  Conjunctivae are normal. No scleral icterus. Neck supple. No cervical LAD. Cardiovascular: Normal rate, regular rhythm. No edema Pulmonary/chest: Effort normal and breath sounds normal. No wheezing, rales or rhonchi. Abdominal: Soft, nondistended, nontender. Bowel sounds active throughout. There are no masses palpable. No hepatomegaly. Neurological: Alert and oriented to person place and time. Skin: Skin is warm and dry. No rashes noted.   ASSESSMENT AND PLAN;   Karen Forbes is a 47 y.o. female presenting with:  1) Hematochezia: Clinical presentation seems most consistent with benign anal rectal bleeding (i.e. hemorrhoids, anal fissure) and will proceed as below:  -Increase dietary fiber and add fiber supplement for goal of 25 g of fiber per day with goal for soft, formed stools without straining to have a BM -Increase daily fluid intake with at least 8 glasses of 8 ounces of water -Avoid straining to have a BM -Avoid prolonged time on the toilet -Discussed use of topical steroid therapy (i.e. Preparation H) for symptomatic hemorrhoids, with caution on prolonged use beyond 7 to 10 days -Given clinical presentation, duration of  symptoms, age, bothersome nature of symptoms, and patient concerns, will evaluate for additional mucosal or luminal etiology for bleeding with colonoscopy.  We will also evaluate at that time for presence and severity of hemorrhoids. -If clinically significant internal hemorrhoids present on colonoscopy, we briefly discussed further treatment with hemorrhoid band ligation at this appointment -Otherwise, no hemodynamic changes or anemia due to hematochezia to warrant hospital admission, blood products, urgent endoscopy, etc. -Return to clinic following colonoscopy  The indications, risks, and benefits of colonoscopy were explained to the patient in detail. Risks include but are not limited to bleeding, perforation, adverse reaction to medications, and cardiopulmonary compromise. Sequelae include but are not limited to the possibility of surgery, hospitalization, and mortality. The patient verbalized understanding and wished to proceed. All questions answered, referred to the scheduler and bowel prep ordered. Further recommendations pending results of the exam.     Lavena Bullion, DO, FACG  08/10/2018, 3:53 PM   Copland, Gay Filler, MD

## 2018-08-17 ENCOUNTER — Other Ambulatory Visit: Payer: Self-pay | Admitting: Gastroenterology

## 2018-08-22 ENCOUNTER — Telehealth: Payer: Self-pay | Admitting: Gastroenterology

## 2018-08-22 ENCOUNTER — Encounter: Payer: Self-pay | Admitting: Gastroenterology

## 2018-08-24 ENCOUNTER — Ambulatory Visit: Payer: Self-pay | Admitting: Gastroenterology

## 2018-08-29 ENCOUNTER — Ambulatory Visit: Payer: BLUE CROSS/BLUE SHIELD | Admitting: Family

## 2018-08-29 ENCOUNTER — Encounter: Payer: Self-pay | Admitting: Family

## 2018-08-29 ENCOUNTER — Telehealth: Payer: Self-pay | Admitting: Family Medicine

## 2018-08-29 VITALS — BP 122/71 | HR 73 | Temp 98.6°F | Resp 16 | Ht 65.0 in | Wt 198.0 lb

## 2018-08-29 DIAGNOSIS — J45909 Unspecified asthma, uncomplicated: Secondary | ICD-10-CM

## 2018-08-29 DIAGNOSIS — L2481 Irritant contact dermatitis due to metals: Secondary | ICD-10-CM | POA: Diagnosis not present

## 2018-08-29 MED ORDER — CLOTRIMAZOLE-BETAMETHASONE 1-0.05 % EX CREA: 1.0000 "application " | TOPICAL_CREAM | Freq: Two times a day (BID) | CUTANEOUS | 0 refills | Status: AC

## 2018-08-29 NOTE — Progress Notes (Signed)
Subjective:    Patient ID: Karen Forbes, female    DOB: April 06, 1971, 47 y.o.   MRN: [id number]  HPI Patient is a 47 year old female who presents today with chief complaint of skin rash.  Skin rash is located on the left wrist. Had been wearing a smart watch on her left wrist for some time.  She first noticed the rash about a week and a half ago.  She placed a smart watch on the opposite wrist.  She has been applying hydrocortisone cream with some improvement.  She has not yet developed a rash under the watch on the right wrist.  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  She had been having some sensitivity to the chemicals in the water with burning in her eyes nose and chest.  They diluted the pool and opened the doors.  She notes she had some mild wheezing this morning while in the pool.  Overall though if the symptoms have improved.  She does have albuterol that she uses on an as-needed basis but has not needed to use at work.  Review of Systems See HPI  Past Medical History:  Diagnosis Date  . Allergy-induced asthma   . Environmental allergies      Social History   Socioeconomic History  . Marital status: Divorced    Spouse name: Not on file  . Number of children: 3  . Years of education: Not on file  . Highest education level: Not on file  Occupational History  . Occupation: Cabin crew  Social Needs  . Financial resource strain: Not on file  . Food insecurity:    Worry: Not on file    Inability: Not on file  . Transportation needs:    Medical: Not on file    Non-medical: Not on file  Tobacco Use  . Smoking status: Never Smoker  . Smokeless tobacco: Never Used  Substance and Sexual Activity  . Alcohol use: Yes    Comment: ocassionally  . Drug use: No  . Sexual activity: Not Currently  Lifestyle  . Physical activity:    Days per week: Not on file    Minutes per session: Not on file  . Stress: Not on file  Relationships  . Social  connections:    Talks on phone: Not on file    Gets together: Not on file    Attends religious service: Not on file    Active member of club or organization: Not on file    Attends meetings of clubs or organizations: Not on file    Relationship status: Not on file  . Intimate partner violence:    Fear of current or ex partner: Not on file    Emotionally abused: Not on file    Physically abused: Not on file    Forced sexual activity: Not on file  Other Topics Concern  . Not on file  Social History Narrative  . Not on file    Past Surgical History:  Procedure Laterality Date  . EYE SURGERY     Corrective Eye Surgery in 1974, 1976 and 1984  . KNEE SURGERY  1997    Family History  Problem Relation Age of Onset  . Arthritis Mother   . Colon polyps Mother   . Hyperlipidemia Father   . Hypertension Father   . Colon polyps Father   . Hypertension Maternal Grandfather   . Diabetes Maternal Grandfather   . Arthritis Paternal Grandmother   .  Breast cancer Paternal Grandmother   . Colon cancer Neg Hx     No Known Allergies  Current Outpatient Medications on File Prior to Visit  Medication Sig Dispense Refill  . albuterol (PROVENTIL HFA;VENTOLIN HFA) 108 (90 Base) MCG/ACT inhaler Inhale 2 puffs into the lungs every 6 (six) hours as needed for wheezing or shortness of breath. 1 Inhaler 0  . cetirizine (ZYRTEC) 10 MG tablet Take 10 mg by mouth daily.    . Sod Picosulfate-Mag Ox-Cit Acd (CLENPIQ) 10-3.5-12 MG-GM -GM/160ML SOLN Take 1 kit by mouth as directed. 2 Bottle 0   No current facility-administered medications on file prior to visit.     BP 122/71 (BP Location: Right Arm, Patient Position: Sitting, Cuff Size: Small)   Pulse 73   Temp 98.6 F (37 C) (Oral)   Resp 16   Ht '5\' 5"'$  (1.651 m)   Wt 198 lb (89.8 kg)   SpO2 98%   BMI 32.95 kg/m       Objective:   Physical Exam  Constitutional: She is oriented to person, place, and time. She appears well-developed and  well-nourished.  Neck: Neck supple. No thyromegaly present.  Cardiovascular: Normal rate, regular rhythm and normal heart sounds.  No murmur heard. Pulmonary/Chest: Effort normal and breath sounds normal. No respiratory distress. She has no wheezes.  Neurological: She is alert and oriented to person, place, and time.  Skin: Skin is warm and dry.  Slightly rough annular rash noted on left dorsal wrist  Psychiatric: She has a normal mood and affect. Her behavior is normal. Judgment and thought content normal.          Assessment & Plan:  Contact dermatitis-there is an area of metal on the smart watch which is in the exact area of her rash.  I suspect that this is a contact dermatitis related to nickel in the watch.  I have advised watch removal.  Trial of Lotrisone cream twice daily.  Follow-up if symptoms worsen or if they fail to improve.  Asthma-she has had some recent flares in the pool.  I suggested that she try taking 2 puffs of the albuterol prior to spending time in the pool to see if this helps with her symptoms.  She has no wheezing today on exam.

## 2018-08-29 NOTE — Telephone Encounter (Signed)
Patient came by the office and was given a sample of her prep.

## 2018-08-29 NOTE — Telephone Encounter (Signed)
PT call back to see wht medication will be sent for the colonoscopy since ins did not cover

## 2018-08-29 NOTE — Patient Instructions (Signed)
Please apply lotrisone twice daily for 1-2 weeks, call if rash worsens or if it does not continue to improve.

## 2018-09-05 ENCOUNTER — Other Ambulatory Visit: Payer: Self-pay

## 2018-09-05 ENCOUNTER — Encounter: Payer: Self-pay | Admitting: Gastroenterology

## 2018-09-05 ENCOUNTER — Ambulatory Visit (AMBULATORY_SURGERY_CENTER): Payer: BLUE CROSS/BLUE SHIELD | Admitting: Gastroenterology

## 2018-09-05 VITALS — BP 124/45 | HR 65 | Temp 97.8°F | Resp 12 | Ht 65.0 in | Wt 198.0 lb

## 2018-09-05 DIAGNOSIS — K921 Melena: Secondary | ICD-10-CM

## 2018-09-05 DIAGNOSIS — D124 Benign neoplasm of descending colon: Secondary | ICD-10-CM | POA: Diagnosis present

## 2018-09-05 DIAGNOSIS — D125 Benign neoplasm of sigmoid colon: Secondary | ICD-10-CM

## 2018-09-05 DIAGNOSIS — K648 Other hemorrhoids: Secondary | ICD-10-CM | POA: Diagnosis not present

## 2018-09-05 DIAGNOSIS — D128 Benign neoplasm of rectum: Secondary | ICD-10-CM

## 2018-09-05 DIAGNOSIS — K641 Second degree hemorrhoids: Secondary | ICD-10-CM

## 2018-09-05 DIAGNOSIS — K5730 Diverticulosis of large intestine without perforation or abscess without bleeding: Secondary | ICD-10-CM | POA: Diagnosis not present

## 2018-09-05 MED ORDER — SODIUM CHLORIDE 0.9 % IV SOLN: 500.0000 mL | Freq: Once | INTRAVENOUS | Status: DC

## 2018-09-05 NOTE — Progress Notes (Signed)
History reviewed today 

## 2018-09-05 NOTE — Op Note (Signed)
[facility] Patient Name: Karen Forbes Procedure Date: 09/05/2018 7:23 AM MRN: [id number] Endoscopist: Gerrit Heck , MD Age: 47 Referring MD:  Date of Birth: 03/10/1971 Gender: Female Account #: [id number] Procedure:                Colonoscopy Indications:              This is the patient's first colonoscopy,                            Hematochezia Medicines:                Monitored Anesthesia Care Procedure:                Pre-Anesthesia Assessment:                           - Prior to the procedure, a History and Physical                            was performed, and patient medications and                            allergies were reviewed. The patient's tolerance of                            previous anesthesia was also reviewed. The risks                            and benefits of the procedure and the sedation                            options and risks were discussed with the patient.                            All questions were answered, and informed consent                            was obtained. Prior Anticoagulants: The patient has                            taken no previous anticoagulant or antiplatelet                            agents. ASA Grade Assessment: II - A patient with                            mild systemic disease. After reviewing the risks                            and benefits, the patient was deemed in                            satisfactory condition to undergo the procedure.  After obtaining informed consent, the colonoscope                            was passed under direct vision. Throughout the                            procedure, the patient's blood pressure, pulse, and                            oxygen saturations were monitored continuously. The                            Colonoscope was introduced through the anus and                            advanced to the the terminal ileum. The colonoscopy                             was performed without difficulty. The patient                            tolerated the procedure well. The quality of the                            bowel preparation was adequate. Scope In: 8:05:18 AM Scope Out: 8:21:41 AM Scope Withdrawal Time: 0 hours 11 minutes 45 seconds  Total Procedure Duration: 0 hours 16 minutes 23 seconds  Findings:                 Hemorrhoids were found on perianal exam.                           Two sessile polyps were found in the rectum and                            descending colon. The polyps were 2 to 3 mm in                            size. These polyps were removed with a cold biopsy                            forceps. Resection and retrieval were complete.                            Estimated blood loss was minimal.                           Multiple small and large-mouthed diverticula were                            found in the sigmoid colon.                           Non-bleeding internal hemorrhoids were found during  retroflexion. The hemorrhoids were medium-sized.                           The terminal ileum appeared normal. Complications:            No immediate complications. Estimated Blood Loss:     Estimated blood loss was minimal. Impression:               - Hemorrhoids found on perianal exam.                           - Two 2 to 3 mm polyps in the rectum and in the                            descending colon, removed with a cold biopsy                            forceps. Resected and retrieved.                           - Diverticulosis in the sigmoid colon.                           - Non-bleeding internal hemorrhoids.                           - The examined portion of the ileum was normal. Recommendation:           - Patient has a contact number available for                            emergencies. The signs and symptoms of potential                            delayed complications  were discussed with the                            patient. Return to normal activities tomorrow.                            Written discharge instructions were provided to the                            patient.                           - Resume previous diet today.                           - Continue present medications.                           - Await pathology results.                           - Repeat colonoscopy in 5-10 years for surveillance  based on pathology results.                           - Return to GI office PRN.                           - Use fiber, for example Citrucel, Fibercon, Konsyl                            or Metamucil.                           - Internal hemorrhoids were noted on this study and                            may be amenable to hemorrhoid band ligation. If you                            are interested in further treatment of these                            hemorrhoids with band ligation, please contact my                            clinic to set up an appointment for evaluation and                            treatment. Gerrit Heck, MD 09/05/2018 8:26:35 AM

## 2018-09-05 NOTE — Progress Notes (Signed)
Called to room to assist during endoscopic procedure.  Patient ID and intended procedure confirmed with present staff. Received instructions for my participation in the procedure from the performing physician.  

## 2018-09-05 NOTE — Patient Instructions (Signed)
Handouts: Polyps  YOU HAD AN ENDOSCOPIC PROCEDURE TODAY AT THE [facility]:   Refer to the procedure report that was given to you for any specific questions about what was found during the examination.  If the procedure report does not answer your questions, please call your gastroenterologist to clarify.  If you requested that your care partner not be given the details of your procedure findings, then the procedure report has been included in a sealed envelope for you to review at your convenience later.  YOU SHOULD EXPECT: Some feelings of bloating in the abdomen. Passage of more gas than usual.  Walking can help get rid of the air that was put into your GI tract during the procedure and reduce the bloating. If you had a lower endoscopy (such as a colonoscopy or flexible sigmoidoscopy) you may notice spotting of blood in your stool or on the toilet paper. If you underwent a bowel prep for your procedure, you may not have a normal bowel movement for a few days.  Please Note:  You might notice some irritation and congestion in your nose or some drainage.  This is from the oxygen used during your procedure.  There is no need for concern and it should clear up in a day or so.  SYMPTOMS TO REPORT IMMEDIATELY:   Following lower endoscopy (colonoscopy or flexible sigmoidoscopy):  Excessive amounts of blood in the stool  Significant tenderness or worsening of abdominal pains  Swelling of the abdomen that is new, acute  Fever of 100F or higher  For urgent or emergent issues, a gastroenterologist can be reached at any hour by calling [phone].   DIET:  We do recommend a small meal at first, but then you may proceed to your regular diet.  Drink plenty of fluids but you should avoid alcoholic beverages for 24 hours.  ACTIVITY:  You should plan to take it easy for the rest of today and you should NOT DRIVE or use heavy machinery until tomorrow (because of the sedation medicines used  during the test).    FOLLOW UP: Our staff will call the number listed on your records the next business day following your procedure to check on you and address any questions or concerns that you may have regarding the information given to you following your procedure. If we do not reach you, we will leave a message.  However, if you are feeling well and you are not experiencing any problems, there is no need to return our call.  We will assume that you have returned to your regular daily activities without incident.  If any biopsies were taken you will be contacted by phone or by letter within the next 1-3 weeks.  Please call us at [phone] if you have not heard about the biopsies in 3 weeks.    SIGNATURES/CONFIDENTIALITY: You and/or your care partner have signed paperwork which will be entered into your electronic medical record.  These signatures attest to the fact that that the information above on your After Visit Summary has been reviewed and is understood.  Full responsibility of the confidentiality of this discharge information lies with you and/or your care-partner. 

## 2018-09-05 NOTE — Progress Notes (Signed)
PT taken to PACU. Monitors in place. VSS. Report given to RN. 

## 2018-09-06 ENCOUNTER — Telehealth: Payer: Self-pay | Admitting: *Deleted

## 2018-09-06 NOTE — Telephone Encounter (Signed)
  Follow up Call-  Call back number 09/05/2018  Post procedure Call Back phone  # [phone]  Permission to leave phone message Yes  Some recent data might be hidden     Patient questions:  Do you have a fever, pain , or abdominal swelling? No. Pain Score  0 *  Have you tolerated food without any problems? Yes.    Have you been able to return to your normal activities? Yes.    Do you have any questions about your discharge instructions: Diet   No. Medications  No. Follow up visit  No.  Do you have questions or concerns about your Care? No.  Actions: * If pain score is 4 or above: No action needed, pain <4.

## 2018-09-09 ENCOUNTER — Encounter: Payer: Self-pay | Admitting: Gastroenterology

## 2018-09-11 NOTE — Progress Notes (Signed)
[facility] at [facility] [address] [place],  [address] [phone]  Date:  09/12/2018   Name:  Karen Forbes   DOB:  1970-12-29   MRN:  [id number]  PCP:  Darreld Mclean, MD    Chief Complaint: Skin lesion (remove skin lesion of back)   History of Present Illness:  Karen Forbes is a 47 y.o. very pleasant female patient who presents with the following:  I last saw her in June She has 3 teens age children and works as a Cabin crew at [employer]  She has a couple of skin lesions which concern her. 1 is on her left sided thoracic back. The other is on her left anterior ribs. Neither are painful, but they do annoy her. She is not sure exactly how long she has had these skin lesions, but they have been present for several years in her estimation.  No history of skin cancer  Patient Active Problem List   Diagnosis Date Noted  . 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 Chronic left shoulder pain 02/08/2017  . Environmental allergies 02/08/2017    Past Medical History:  Diagnosis Date  . Allergy   . Allergy-induced asthma   . Environmental allergies     Past Surgical History:  Procedure Laterality Date  . EYE SURGERY     Corrective Eye Surgery in 1974, 1976 and 1984  . KNEE SURGERY  1997    Social History   Tobacco Use  . Smoking status: Never Smoker  . Smokeless tobacco: Never Used  Substance Use Topics  . Alcohol use: Yes    Comment: ocassionally  . Drug use: No    Family History  Problem Relation Age of Onset  . Arthritis Mother   . Colon polyps Mother   . Hyperlipidemia Father   . Hypertension Father   . Colon polyps Father   . Hypertension Maternal Grandfather   . Diabetes Maternal Grandfather   . Arthritis Paternal Grandmother   . Breast cancer Paternal Grandmother   . Colon cancer Neg Hx   . Esophageal cancer Neg Hx   . Rectal cancer Neg Hx   . Stomach cancer Neg Hx      Allergies  Allergen Reactions  . Sulfites Anaphylaxis    Medication list has been reviewed and updated.  Current Outpatient Medications on File Prior to Visit  Medication Sig Dispense Refill  . albuterol (PROVENTIL HFA;VENTOLIN HFA) 108 (90 Base) MCG/ACT inhaler Inhale 2 puffs into the lungs every 6 (six) hours as needed for wheezing or shortness of breath. 1 Inhaler 0  . cetirizine (ZYRTEC) 10 MG tablet Take 10 mg by mouth daily.    . clotrimazole-betamethasone (LOTRISONE) cream Apply 1 application topically 2 (two) times daily. 30 g 0   No current facility-administered medications on file prior to visit.     Review of Systems:  As per HPI- otherwise negative.   Physical Examination: Vitals:   09/12/18 1537  BP: 120/76  Pulse: 87  Resp: 16  SpO2: 98%   Vitals:   09/12/18 1537  Weight: 199 lb (90.3 kg)  Height: 5\' 5"  (1.651 m)   Body mass index is 33.12 kg/m. Ideal Body Weight: Weight in (lb) to have BMI = 25: 149.9  GEN: WDWN, NAD, Non-toxic, Alert & Oriented x 3 HEENT: Atraumatic, Normocephalic.  Ears and Nose: No external deformity. EXTR: No clubbing/cyanosis/edema NEURO: Normal gait.  PSYCH: Normally interactive. Conversant. Not  depressed or anxious appearing.  Calm demeanor.   Verbal consent obtained. scrubbed nodule on her left thoracic back with Betadine, and anesthetized with 1 mL of 1% lidocaine. Attempted to shave lesion, but it was not firm enough to allow for a shave.  Was only able to shave off the superior portion of the lesion At this point I do not feel a punch is necessary, and the patient is comfortable leaving this alone Dressed with a Band-Aid  Scrubbed second lesion over left ribs with Betadine. Anesthetized with 1% lidocaine. Shaved with derma blade, lesion to pathology Dressed with Band-Aid  Estimated blood loss 2 mL's No immediate complications Assessment and Plan: Skin lesion - Plan: Dermatology pathology([facility])  Here  today with concern of 2 skin lesions as above. Only one was amenable to shave removal, the other is not raised enough. As the lesion on her back appears consistent with a benign skin nodule, we decided to leave it alone for the time being. Went over wound care instructions with patient, see patient structures for further detail  Signed Lamar Blinks, MD

## 2018-09-12 ENCOUNTER — Encounter: Payer: Self-pay | Admitting: Family Medicine

## 2018-09-12 ENCOUNTER — Ambulatory Visit: Payer: BLUE CROSS/BLUE SHIELD | Admitting: Family Medicine

## 2018-09-12 VITALS — BP 120/76 | HR 87 | Resp 16 | Ht 65.0 in | Wt 199.0 lb

## 2018-09-12 DIAGNOSIS — D225 Melanocytic nevi of trunk: Secondary | ICD-10-CM

## 2018-09-12 DIAGNOSIS — L989 Disorder of the skin and subcutaneous tissue, unspecified: Secondary | ICD-10-CM

## 2018-09-12 NOTE — Patient Instructions (Signed)
We removed the skin lesion from your left ribs today, and I sent the lesion in for pathology analysis.  The lesion on your back could not be easily removed, we will leave it alone for now I will be in touch with your pathology report ASAP. If you have any bleeding, apply pressure for 10 to 15 minutes. Keep the wounds covered and clean until tomorrow, then you may bathe as usual.  Keep a Band-Aid on as needed. If any sign of infection such as redness, swelling, heat, pain, or pus, please let me know right away

## 2018-09-20 ENCOUNTER — Encounter: Payer: Self-pay | Admitting: Family Medicine

## 2018-09-22 ENCOUNTER — Telehealth: Payer: Self-pay | Admitting: *Deleted

## 2018-09-22 NOTE — Telephone Encounter (Signed)
Received Dermatopathology Report results from [facility]; forwarded to provider/SLS 01/02

## 2018-10-03 NOTE — Telephone Encounter (Signed)
Done

## 2018-10-12 ENCOUNTER — Telehealth: Payer: Self-pay | Admitting: Family Medicine

## 2018-10-12 MED ORDER — OSELTAMIVIR PHOSPHATE 75 MG PO CAPS: 75.0000 mg | ORAL_CAPSULE | Freq: Every day | ORAL | 0 refills | Status: AC

## 2018-10-12 NOTE — Telephone Encounter (Signed)
Copied from [place]. Topic: General - Inquiry >> Oct 12, 2018  8:43 AM Margot Ables wrote: Reason for CRM: pts daughter tested positive for flu A yesterday. Requesting Tamiflu from Dr. 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 She has some congestion, sneezing, and coughing but denies fever, chills, diarrhea. Please advise. Per pt ok to leave detailed msg.  [place], [place] [phone] (Phone) [phone] (Fax)

## 2018-10-12 NOTE — Telephone Encounter (Signed)
Called pt- I sent in the rx for her

## 2019-09-27 IMAGING — MG DIGITAL SCREENING BILATERAL MAMMOGRAM WITH TOMO AND CAD
8 series · 8 of 24 positions shown · non-contrast
Comparison: Previous exam(s).

CLINICAL DATA: Screening.

EXAM:
DIGITAL SCREENING BILATERAL MAMMOGRAM WITH TOMO AND CAD

[L CC synth-2D]
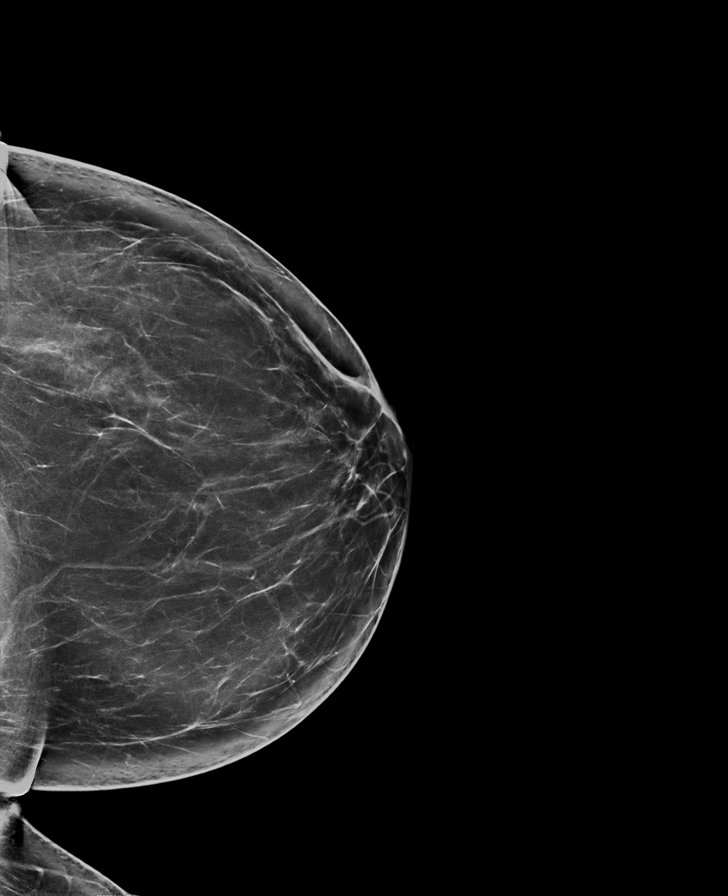

[R MLO synth-2D]
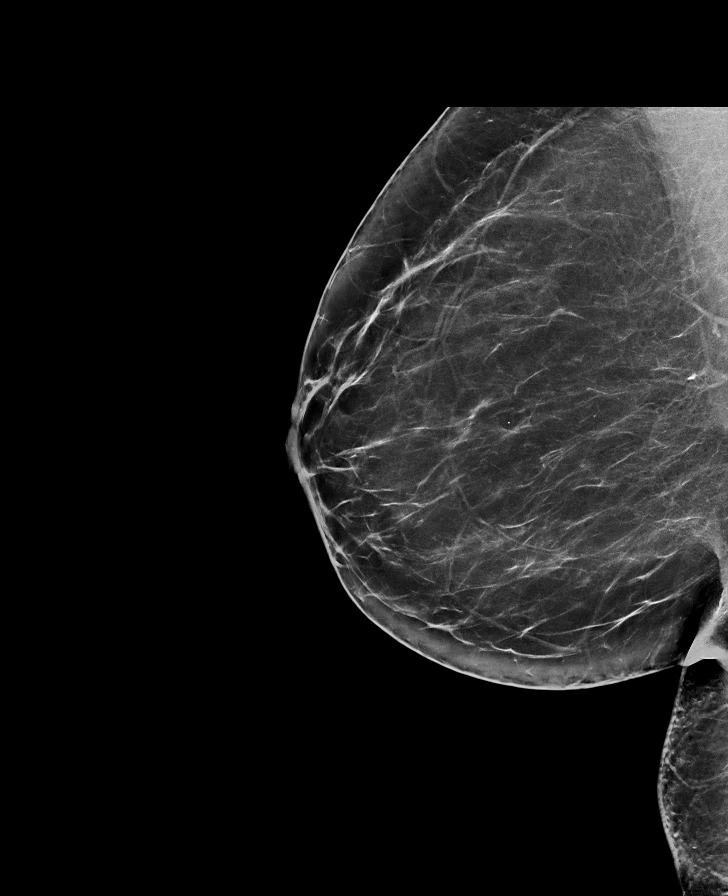

[L MLO synth-2D]
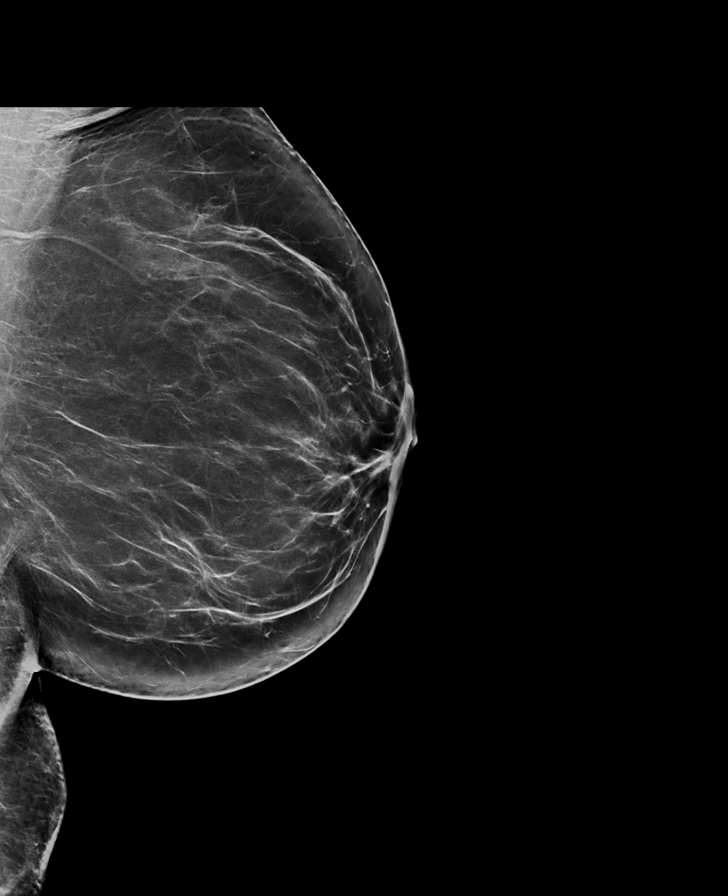

[R CC synth-2D]
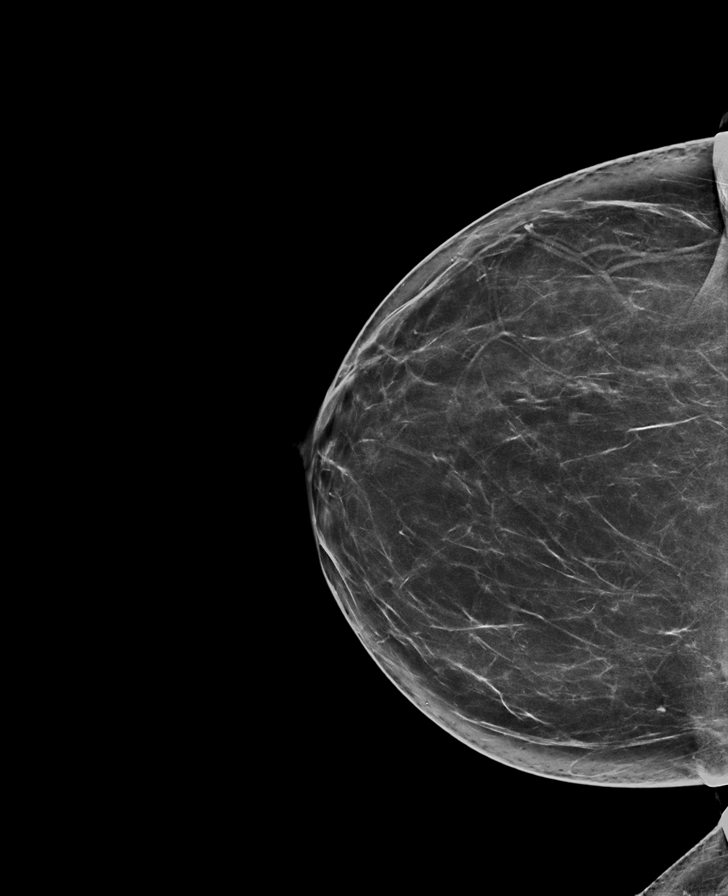

[L MLO tomo · tomo slice 41/82.0]
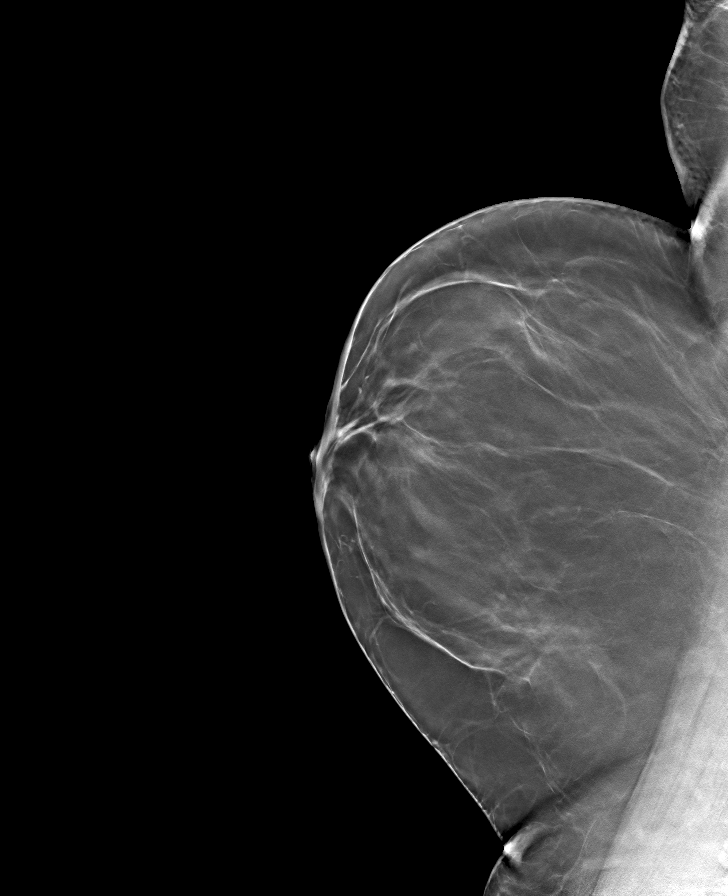

[R MLO tomo · tomo slice 41/82.0]
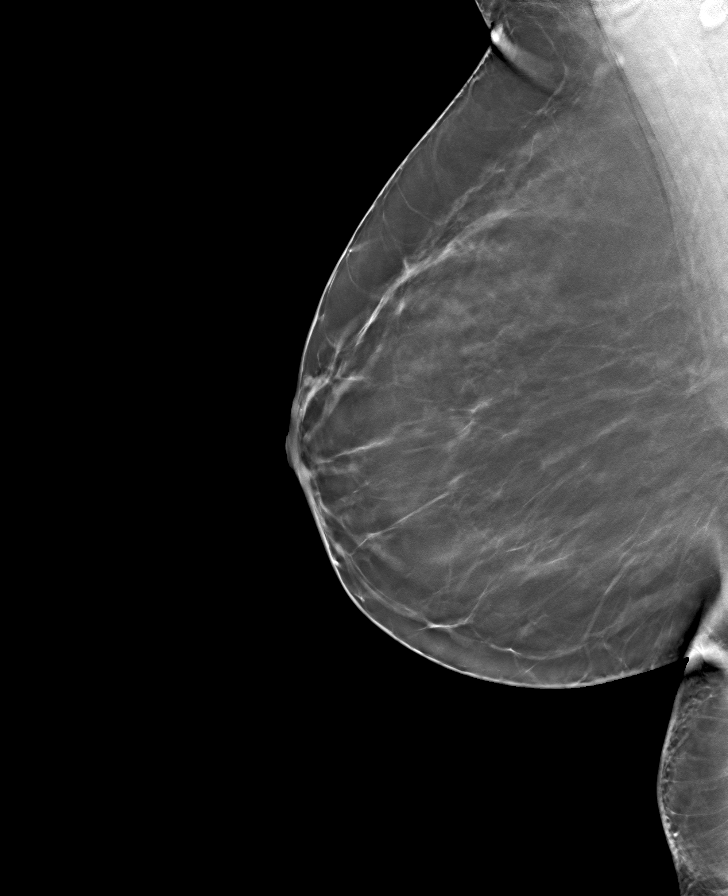

[L CC tomo · tomo slice 41/81.0]
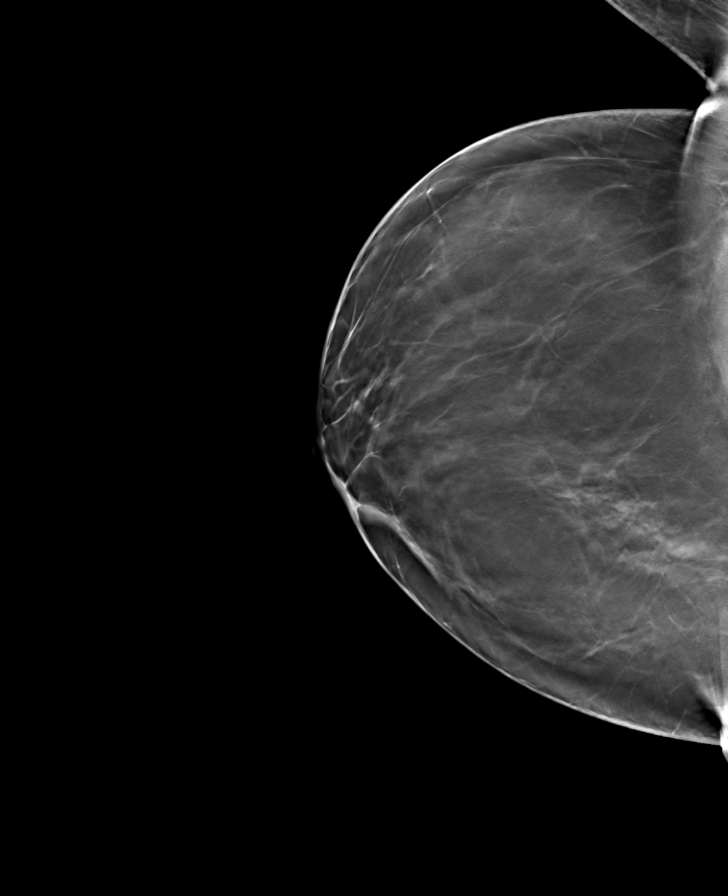

[R CC tomo · tomo slice 39/77.0]
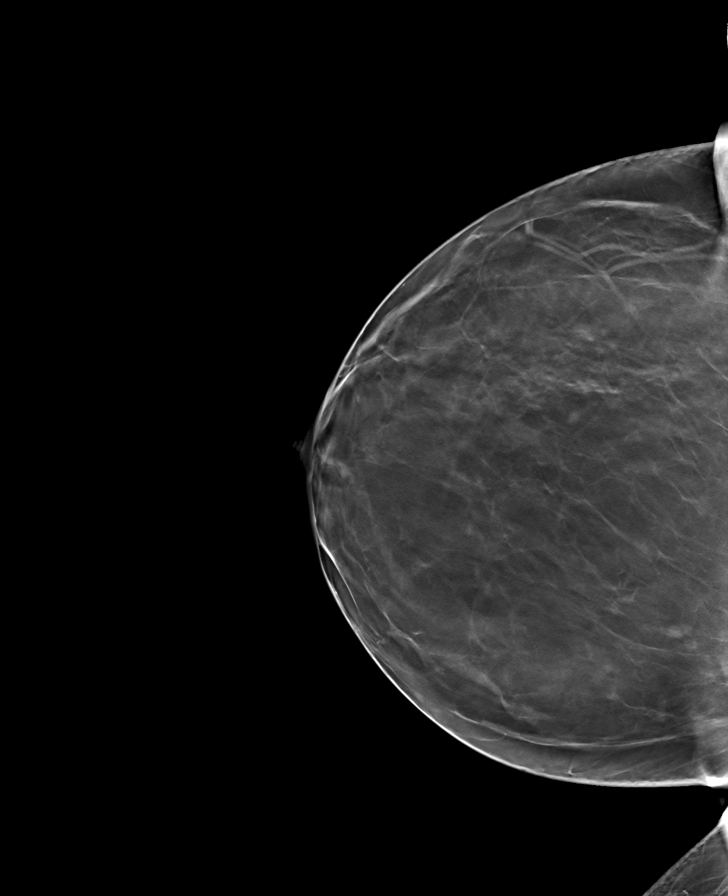

[8 of 24 positions shown; findings below may reference images not displayed]

ACR Breast Density Category b: There are scattered areas of
fibroglandular density.
FINDINGS: There are no findings suspicious for malignancy. Images were
processed with CAD.
IMPRESSION: No mammographic evidence of malignancy. A result letter of this
screening mammogram will be mailed directly to the patient.

RECOMMENDATION:
Screening mammogram in one year. (Code:[id number])

BI-RADS CATEGORY  1: Negative.

## 2021-02-06 ENCOUNTER — Other Ambulatory Visit: Payer: Self-pay

## 2021-02-06 ENCOUNTER — Emergency Department (HOSPITAL_BASED_OUTPATIENT_CLINIC_OR_DEPARTMENT_OTHER)
Admission: EM | Admit: 2021-02-06 | Discharge: 2021-02-07 | Disposition: A | Discharge status: Home / Self Care | Payer: BLUE CROSS/BLUE SHIELD | Attending: Emergency Medicine | Admitting: Emergency Medicine

## 2021-02-06 ENCOUNTER — Encounter (HOSPITAL_BASED_OUTPATIENT_CLINIC_OR_DEPARTMENT_OTHER): Payer: Self-pay | Admitting: *Deleted

## 2021-02-06 ENCOUNTER — Emergency Department (HOSPITAL_BASED_OUTPATIENT_CLINIC_OR_DEPARTMENT_OTHER): Payer: BLUE CROSS/BLUE SHIELD

## 2021-02-06 DIAGNOSIS — S82831A Other fracture of upper and lower end of right fibula, initial encounter for closed fracture: Secondary | ICD-10-CM | POA: Diagnosis not present

## 2021-02-06 DIAGNOSIS — W1840XA Slipping, tripping and stumbling without falling, unspecified, initial encounter: Secondary | ICD-10-CM | POA: Insufficient documentation

## 2021-02-06 DIAGNOSIS — S99911A Unspecified injury of right ankle, initial encounter: Secondary | ICD-10-CM | POA: Diagnosis present

## 2021-02-06 DIAGNOSIS — S8251XA Displaced fracture of medial malleolus of right tibia, initial encounter for closed fracture: Secondary | ICD-10-CM | POA: Diagnosis not present

## 2021-02-06 DIAGNOSIS — S82891A Other fracture of right lower leg, initial encounter for closed fracture: Secondary | ICD-10-CM

## 2021-02-06 DIAGNOSIS — Y9301 Activity, walking, marching and hiking: Secondary | ICD-10-CM | POA: Insufficient documentation

## 2021-02-06 NOTE — ED Triage Notes (Signed)
C/o right ankle injury x 20 mins ago

## 2021-02-07 MED ORDER — MORPHINE SULFATE (PF) 4 MG/ML IV SOLN
6.0000 mg | Freq: Once | INTRAVENOUS | Status: AC
  Administered 2021-02-07: 6 mg via INTRAMUSCULAR
  Filled 2021-02-07: qty 2

## 2021-02-07 NOTE — Discharge Instructions (Addendum)
Call your orthopedic specialist as discussed in the next 2-3 days.  You may follow-up with-year-old orthopedic surgeon or the new surgeon whose information I have listed on the discharge paperwork.  Keep the splint dry at all times.  No weightbearing on your right ankle, use the crutches as needed.   Return immediately back to the ER if:  Your symptoms worsen within the next 12-24 hours. You develop new symptoms such as new fevers, persistent vomiting, new pain, shortness of breath, or new weakness or numbness, or if you have any other concerns.

## 2021-02-07 NOTE — ED Provider Notes (Signed)
[facility] EMERGENCY DEPARTMENT Provider Note   CSN: [id number] Arrival date & time: 02/06/21  2139     History Chief Complaint  Patient presents with  . Ankle Injury    Karen Forbes is a 50 y.o. female.  Patient states that she as she was walking out she slipped and twisted on her right ankle today prior to arrival.  Pain is described as sharp and severe.  Having significant pain they are unable to weight-bear.  Denies injury elsewhere.  Denies headache or neck pain or other fall.        Past Medical History:  Diagnosis Date  . Allergy   . Allergy-induced asthma   . Environmental allergies     Patient Active Problem List   Diagnosis Date Noted  . 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 Chronic left shoulder pain 02/08/2017  . Environmental allergies 02/08/2017    Past Surgical History:  Procedure Laterality Date  . EYE SURGERY     Corrective Eye Surgery in 1974, 1976 and 1984  . KNEE SURGERY  1997     OB History    Gravida  3   Para  3   Term      Preterm      AB      Living        SAB      IAB      Ectopic      Multiple      Live Births  3           Family History  Problem Relation Age of Onset  . Arthritis Mother   . Colon polyps Mother   . Hyperlipidemia Father   . Hypertension Father   . Colon polyps Father   . Hypertension Maternal Grandfather   . Diabetes Maternal Grandfather   . Arthritis Paternal Grandmother   . Breast cancer Paternal Grandmother   . Colon cancer Neg Hx   . Esophageal cancer Neg Hx   . Rectal cancer Neg Hx   . Stomach cancer Neg Hx     Social History   Tobacco Use  . Smoking status: Never Smoker  . Smokeless tobacco: Never Used  Vaping Use  . Vaping Use: Never used  Substance Use Topics  . Alcohol use: Yes    Comment: ocassionally  . Drug use: No    Home Medications Prior to Admission medications   Medication Sig Start Date End Date Taking? Authorizing Provider  albuterol  (PROVENTIL HFA;VENTOLIN HFA) 108 (90 Base) MCG/ACT inhaler Inhale 2 puffs into the lungs every 6 (six) hours as needed for wheezing or shortness of breath. 02/08/17   Copland, Gay Filler, MD  benzonatate (TESSALON) 100 MG capsule Take 100 mg by mouth 3 (three) times daily as needed. 01/21/21   [provider]  cetirizine (ZYRTEC) 10 MG tablet Take 10 mg by mouth daily.    [provider]  clotrimazole-betamethasone (LOTRISONE) cream Apply 1 application topically 2 (two) times daily. 08/29/18   Debbrah Alar, NP  EPINEPHrine 0.3 mg/0.3 mL IJ SOAJ injection SMARTSIG:1 Pre-Filled Pen Syringe IM PRN 08/14/20   [provider]  oseltamivir (TAMIFLU) 75 MG capsule Take 1 capsule (75 mg total) by mouth daily. 10/12/18   Copland, Gay Filler, MD  PAXLOVID 20 x 150 MG & 10 x 100MG  TBPK Take by mouth as directed. 01/21/21   [provider]    Allergies    Sulfites  Review of Systems   Review of Systems  Constitutional: Negative for fever.  HENT: Negative for ear pain.   Eyes: Negative for pain.  Respiratory: Negative for cough.   Cardiovascular: Negative for chest pain.  Gastrointestinal: Negative for abdominal pain.  Genitourinary: Negative for flank pain.  Musculoskeletal: Negative for back pain.  Skin: Negative for rash.  Neurological: Negative for headaches.    Physical Exam Updated Vital Signs BP 132/74 (BP Location: Left Arm)   Pulse 71   Temp 98.9 F (37.2 C) (Oral)   Resp 18   Ht 5\' 5"  (1.651 m)   Wt 90.7 kg   LMP 02/03/2021   SpO2 99%   BMI 33.28 kg/m   Physical Exam Constitutional:      General: She is not in acute distress.    Appearance: Normal appearance.  HENT:     Head: Normocephalic.     Nose: Nose normal.  Eyes:     Extraocular Movements: Extraocular movements intact.  Cardiovascular:     Rate and Rhythm: Normal rate.  Pulmonary:     Effort: Pulmonary effort is normal.  Musculoskeletal:        General: Normal range of  motion.     Cervical back: Normal range of motion.     Comments: Swelling tenderness deformity to the right ankle.  Neurovascularly intact otherwise.  No laceration noted.  Neurological:     General: No focal deficit present.     Mental Status: She is alert. Mental status is at baseline.     ED Results / Procedures / Treatments   Labs (all labs ordered are listed, but only abnormal results are displayed) Labs Reviewed - No data to display  EKG None  Radiology DG Ankle Complete Right  Result Date: 02/06/2021 CLINICAL DATA:  Fall, ankle injury EXAM: RIGHT ANKLE - COMPLETE 3+ VIEW COMPARISON:  None. FINDINGS: Acute oblique fracture involving distal shaft and metaphysis of the fibula with mild displacement. Acute mildly displaced posterior malleolar fracture. No definitive medial malleolar fracture. Mortise symmetric on oblique view. Positive for soft tissue swelling. Moderate plantar calcaneal spur IMPRESSION: 1. Acute mildly displaced distal fibular fracture. 2. Acute posterior malleolar fracture of the tibia Electronically Signed   By: Donavan Foil M.D.   On: 02/06/2021 22:52    Procedures .Ortho Injury Treatment  Date/Time: 02/07/2021 1:58 AM Performed by: Luna Fuse, MD Authorized by: Luna Fuse, MD  Immobilization: splint Post-procedure neurovascular assessment: post-procedure neurovascularly intact Comments: Right lower extremity short leg splint with stirrups placed by myself and an aide.  Patient neurovascularly intact after placement.      Medications Ordered in ED Medications  morphine 4 MG/ML injection 6 mg (6 mg Intramuscular Given 02/07/21 0059)    ED Course  I have reviewed the triage vital signs and the nursing notes.  Pertinent labs & imaging results that were available during my care of the patient were reviewed by me and considered in my medical decision making (see chart for details).    MDM Rules/Calculators/A&P                          X-ray  concerning for right ankle mildly displaced fibular fracture and posterior malleolus fracture.  Patient placed in a right leg short leg splint with stirrups.  Neurovascular intact after splint placement.  Advised outpatient follow-up with orthopedics within 4 to 5 days.  Advised immediate return for worsening pain fevers or any additional concerns.  Final Clinical Impression(s) / ED Diagnoses Final diagnoses:  Closed fracture of right ankle, initial encounter    Rx / DC Orders ED Discharge Orders    None       Joffre, Greggory Brandy, MD 02/07/21 0200

## 2022-08-29 IMAGING — DX DG ANKLE COMPLETE 3+V*R*
3 series · 3 of 3 positions shown · non-contrast
Comparison: None.

CLINICAL DATA: Fall, ankle injury

EXAM:
RIGHT ANKLE - COMPLETE 3+ VIEW

[ankle ap]
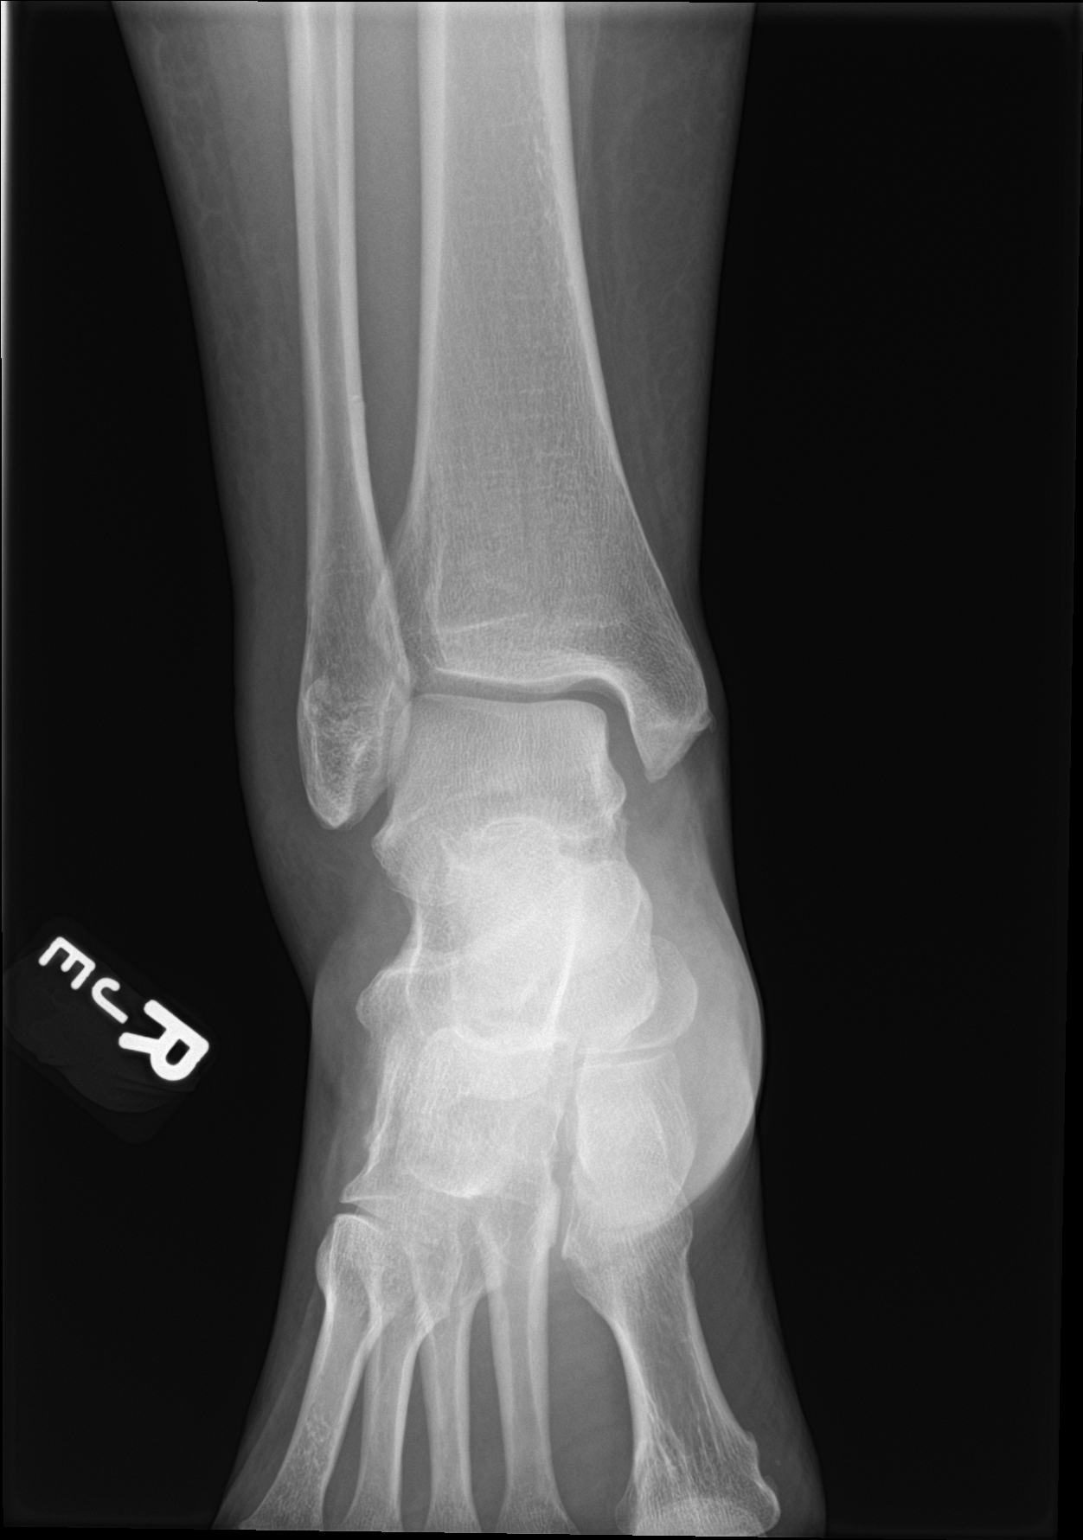

[ankle obl]
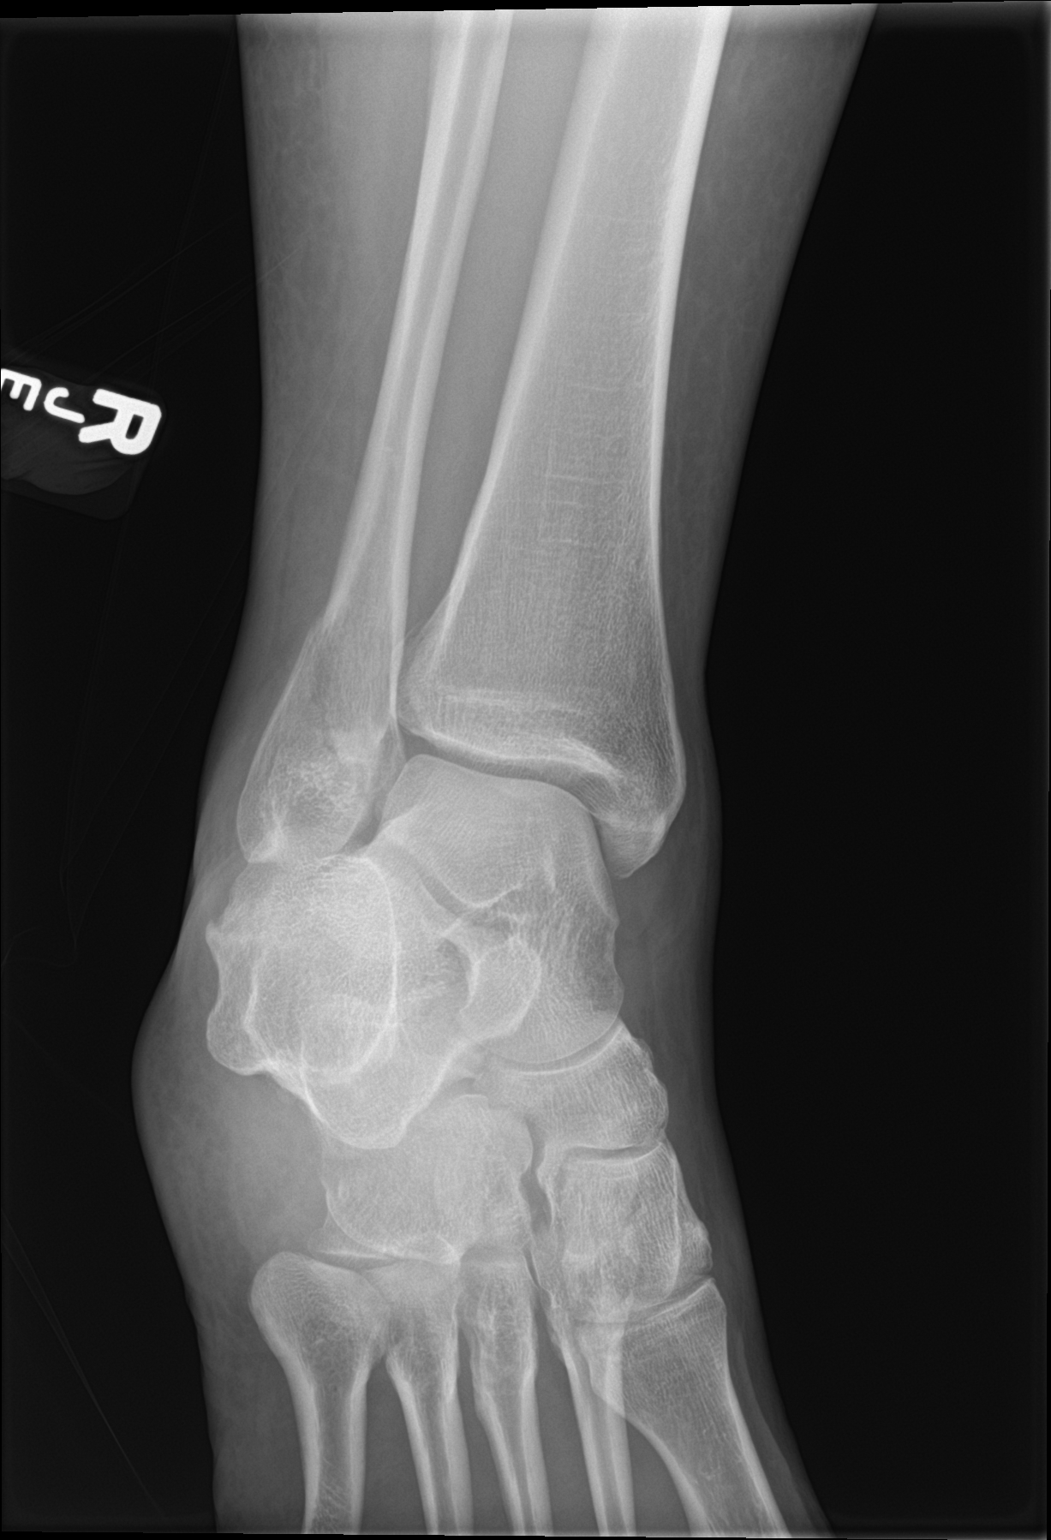

[ankle lat]
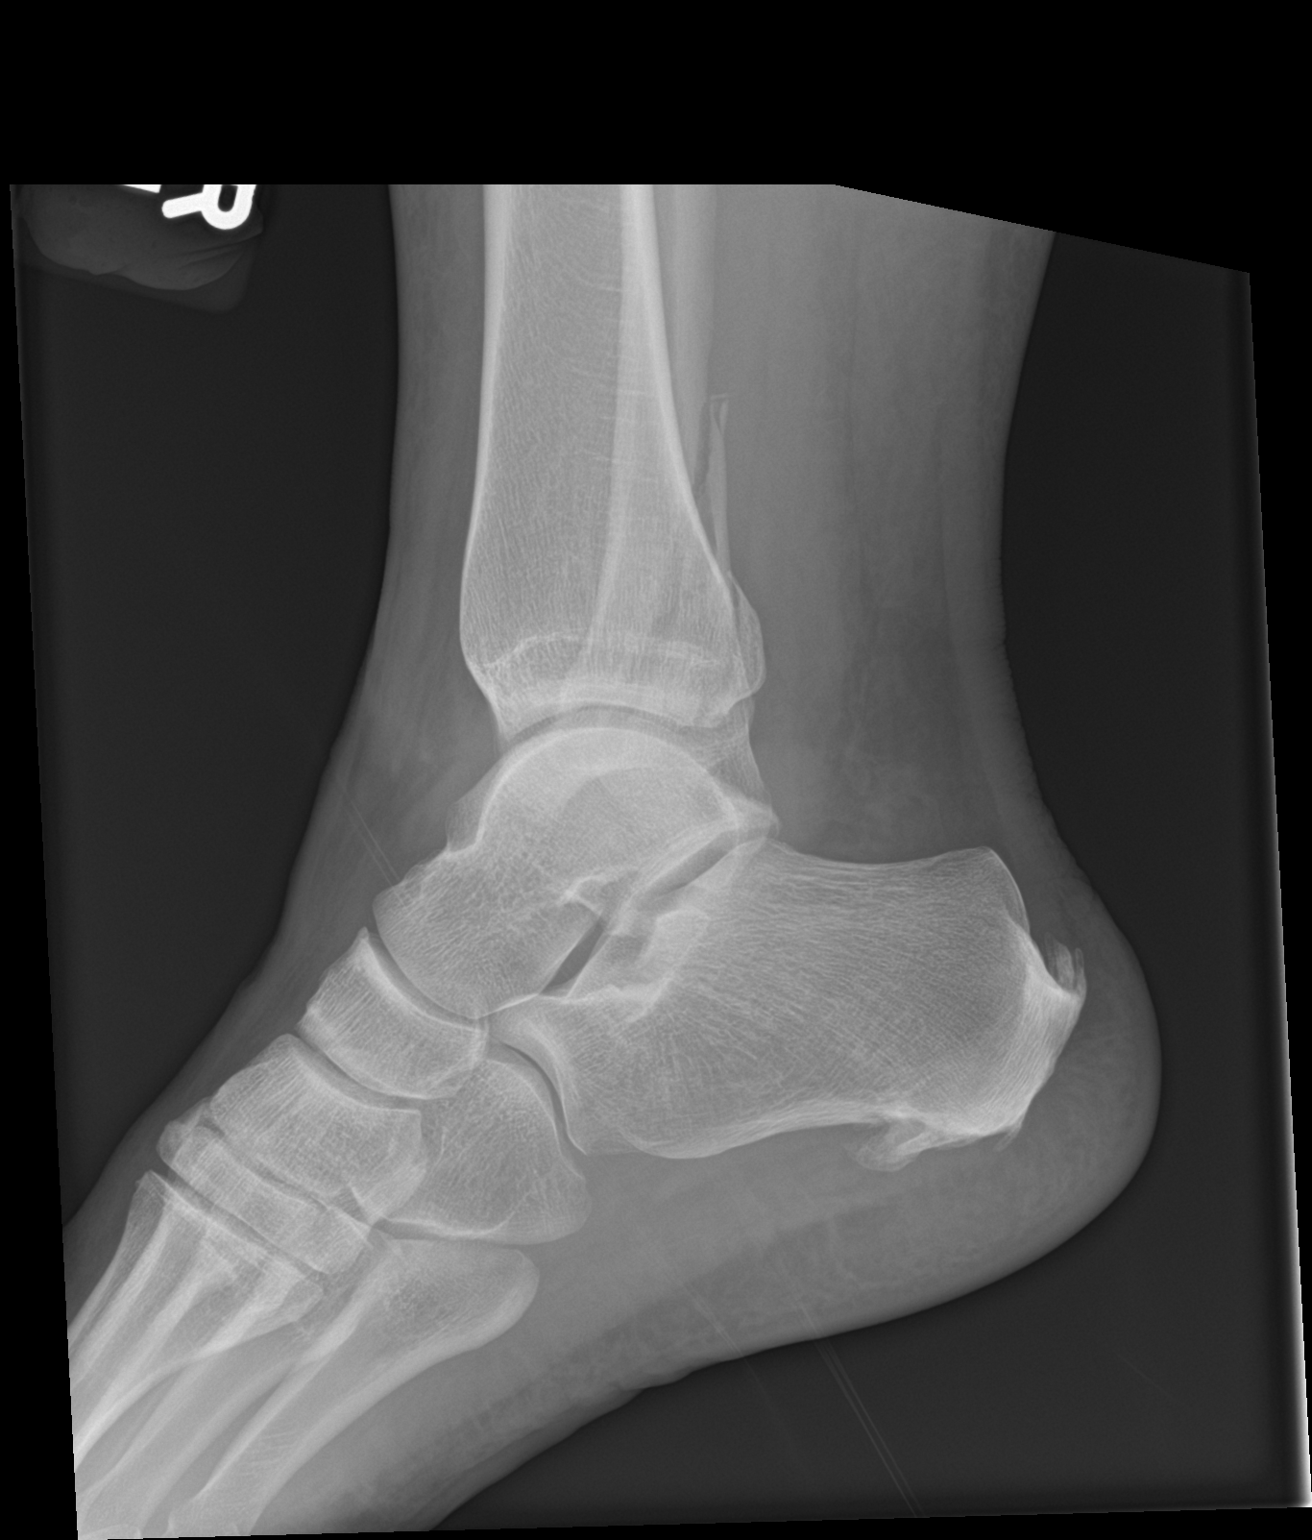

[3 of 3 positions shown; findings below may reference images not displayed]

FINDINGS: Acute oblique fracture involving distal shaft and metaphysis of the
fibula with mild displacement. Acute mildly displaced posterior
malleolar fracture. No definitive medial malleolar fracture. Mortise
symmetric on oblique view. Positive for soft tissue swelling.
Moderate plantar calcaneal spur
IMPRESSION: 1. Acute mildly displaced distal fibular fracture.
2. Acute posterior malleolar fracture of the tibia

## 2024-06-30 ENCOUNTER — Other Ambulatory Visit (HOSPITAL_BASED_OUTPATIENT_CLINIC_OR_DEPARTMENT_OTHER): Payer: Self-pay

## 2024-06-30 MED ORDER — FLUZONE 0.5 ML IM SUSY
0.5000 mL | PREFILLED_SYRINGE | Freq: Once | INTRAMUSCULAR | 0 refills | Status: AC
  Filled 2024-06-30: qty 0.5, 1d supply, fill #0
